# Patient Record
Sex: Female | Born: 1985 | Race: White | Hispanic: No | Marital: Married | Smoking: Never smoker
Health system: Southern US, Community
[De-identification: ages and names within clinical notes are randomized; demographics above are authoritative.]

## PROBLEM LIST (undated history)

## (undated) DIAGNOSIS — Z789 Other specified health status: Secondary | ICD-10-CM

## (undated) HISTORY — PX: NO PAST SURGERIES: SHX2092

## (undated) HISTORY — DX: Other specified health status: Z78.9

---

## 2018-07-15 ENCOUNTER — Ambulatory Visit (INDEPENDENT_AMBULATORY_CARE_PROVIDER_SITE_OTHER): Payer: BLUE CROSS/BLUE SHIELD | Admitting: Emergency Medicine

## 2018-07-15 ENCOUNTER — Encounter: Payer: Self-pay | Admitting: Family Medicine

## 2018-07-15 DIAGNOSIS — Z3201 Encounter for pregnancy test, result positive: Secondary | ICD-10-CM | POA: Diagnosis not present

## 2018-07-15 LAB — POCT PREGNANCY, URINE: Preg Test, Ur: POSITIVE — AB

## 2018-07-15 MED ORDER — PRENATAL PLUS 27-1 MG PO TABS: 1.0000 | ORAL_TABLET | Freq: Every day | ORAL | 11 refills | Status: DC

## 2018-07-15 MED ORDER — PROMETHAZINE HCL 25 MG PO TABS: 25.0000 mg | ORAL_TABLET | Freq: Four times a day (QID) | ORAL | 1 refills | Status: DC | PRN

## 2018-07-15 NOTE — Progress Notes (Signed)
Pt presents to the office today for UPT. UPT positive. Pt reports LMP 05/22/18. EDD 02/26/19 [redacted]w[redacted]d. Pt reports spotting two weeks ago x1 day and occasional mild cramps. Pt reassured unless she begins bleeding like a cycle or has severe pain that these symptoms can be normal in early pregnancy. Pt denies taking any medications or having any allergies to medications at this time. Pt reports having nausea and vomiting and would like Rx. Rx given for PNV and phenergan per protocol. Advised she begin prenatal care. Pt verbalized understanding and had no further questions.   Kathy Jan, RN 07/15/18

## 2018-07-15 NOTE — Progress Notes (Signed)
I have reviewed the chart and agree with nursing staff's documentation of this patient's encounter.  Thressa Sheller DNP, CNM  07/15/18  4:44 PM

## 2018-07-16 ENCOUNTER — Ambulatory Visit: Payer: Self-pay

## 2018-08-20 ENCOUNTER — Other Ambulatory Visit: Payer: Self-pay

## 2018-08-20 ENCOUNTER — Ambulatory Visit (INDEPENDENT_AMBULATORY_CARE_PROVIDER_SITE_OTHER): Payer: BLUE CROSS/BLUE SHIELD | Admitting: *Deleted

## 2018-08-20 ENCOUNTER — Encounter: Payer: Self-pay | Admitting: *Deleted

## 2018-08-20 VITALS — BP 110/62 | HR 77 | Ht 64.0 in | Wt 110.0 lb

## 2018-08-20 DIAGNOSIS — Z113 Encounter for screening for infections with a predominantly sexual mode of transmission: Secondary | ICD-10-CM | POA: Diagnosis not present

## 2018-08-20 DIAGNOSIS — Z23 Encounter for immunization: Secondary | ICD-10-CM

## 2018-08-20 DIAGNOSIS — O09299 Supervision of pregnancy with other poor reproductive or obstetric history, unspecified trimester: Secondary | ICD-10-CM

## 2018-08-20 DIAGNOSIS — Z789 Other specified health status: Secondary | ICD-10-CM

## 2018-08-20 DIAGNOSIS — O0990 Supervision of high risk pregnancy, unspecified, unspecified trimester: Secondary | ICD-10-CM

## 2018-08-20 DIAGNOSIS — Z758 Other problems related to medical facilities and other health care: Secondary | ICD-10-CM

## 2018-08-20 DIAGNOSIS — O09219 Supervision of pregnancy with history of pre-term labor, unspecified trimester: Secondary | ICD-10-CM

## 2018-08-20 LAB — POCT URINALYSIS DIP (DEVICE)
Bilirubin Urine: NEGATIVE
Glucose, UA: NEGATIVE mg/dL
Hgb urine dipstick: NEGATIVE
Ketones, ur: 15 mg/dL — AB
Leukocytes,Ua: NEGATIVE
NITRITE: NEGATIVE
Protein, ur: NEGATIVE mg/dL
Specific Gravity, Urine: >=1.03 (ref 1.005–1.030)
Urobilinogen, UA: 0.2 mg/dL (ref 0.0–1.0)
pH: 5.5 (ref 5.0–8.0)

## 2018-08-20 NOTE — Progress Notes (Signed)
New Ob intake completed and pregnancy information packet given. Pt's husband present. He speaks and reads English and will read pregnancy information to pt. Video interpreter Marwa [phone] used for encounter. Labs drawn. Pt expressed intense concern for this pregnancy due to her history of preterm birth and neonatal death 2 days after birth. She was only able to provide minimal information surrounding the birth. She states she had normal pregnancy until her water broke @ 29.[redacted] wks EGA. She delivered in [place] and was told no abnormalities of the baby. Emotional support provided. Initial prenatal visit scheduled on 3/26.

## 2018-08-21 ENCOUNTER — Encounter: Payer: Self-pay | Admitting: *Deleted

## 2018-08-21 DIAGNOSIS — O0990 Supervision of high risk pregnancy, unspecified, unspecified trimester: Secondary | ICD-10-CM | POA: Insufficient documentation

## 2018-08-21 DIAGNOSIS — O09299 Supervision of pregnancy with other poor reproductive or obstetric history, unspecified trimester: Secondary | ICD-10-CM | POA: Insufficient documentation

## 2018-08-21 DIAGNOSIS — O09219 Supervision of pregnancy with history of pre-term labor, unspecified trimester: Secondary | ICD-10-CM | POA: Insufficient documentation

## 2018-08-21 DIAGNOSIS — Z789 Other specified health status: Secondary | ICD-10-CM | POA: Insufficient documentation

## 2018-08-22 LAB — CULTURE, OB URINE

## 2018-08-22 LAB — GC/CHLAMYDIA PROBE AMP (~~LOC~~) NOT AT ARMC
Chlamydia: NEGATIVE
Neisseria Gonorrhea: NEGATIVE

## 2018-08-22 LAB — URINE CULTURE, OB REFLEX: Organism ID, Bacteria: NO GROWTH

## 2018-08-22 NOTE — Progress Notes (Signed)
I have reviewed this chart and agree with the RN/CMA assessment and management.    K. Meryl Davis, M.D. Attending [facility] (Faculty Practice)   

## 2018-09-03 ENCOUNTER — Encounter: Payer: BLUE CROSS/BLUE SHIELD | Admitting: Student

## 2018-09-03 ENCOUNTER — Encounter: Payer: BLUE CROSS/BLUE SHIELD | Admitting: Family Medicine

## 2018-09-03 LAB — HEMOGLOBINOPATHY EVALUATION
Ferritin: 64 ng/mL (ref 15–150)
Hgb A2 Quant: 2.3 % (ref 1.8–3.2)
Hgb A: 97.7 % (ref 96.4–98.8)
Hgb C: 0 %
Hgb F Quant: 0 % (ref 0.0–2.0)
Hgb S: 0 %
Hgb Solubility: NEGATIVE
Hgb Variant: 0 %

## 2018-09-03 LAB — OBSTETRIC PANEL, INCLUDING HIV
ANTIBODY SCREEN: NEGATIVE
BASOS: 1 %
Basophils Absolute: 0 10*3/uL (ref 0.0–0.2)
EOS (ABSOLUTE): 0 10*3/uL (ref 0.0–0.4)
Eos: 1 %
HEMATOCRIT: 31.1 % — AB (ref 34.0–46.6)
HIV Screen 4th Generation wRfx: NONREACTIVE
Hemoglobin: 10.9 g/dL — ABNORMAL LOW (ref 11.1–15.9)
Hepatitis B Surface Ag: NEGATIVE
IMMATURE GRANS (ABS): 0 10*3/uL (ref 0.0–0.1)
Immature Granulocytes: 0 %
Lymphocytes Absolute: 0.9 10*3/uL (ref 0.7–3.1)
Lymphs: 29 %
MCH: 32.1 pg (ref 26.6–33.0)
MCHC: 35 g/dL (ref 31.5–35.7)
MCV: 92 fL (ref 79–97)
Monocytes Absolute: 0.3 10*3/uL (ref 0.1–0.9)
Monocytes: 9 %
Neutrophils Absolute: 1.8 10*3/uL (ref 1.4–7.0)
Neutrophils: 60 %
Platelets: 206 10*3/uL (ref 150–450)
RBC: 3.4 x10E6/uL — ABNORMAL LOW (ref 3.77–5.28)
RDW: 13.8 % (ref 11.7–15.4)
RH TYPE: POSITIVE
RPR Ser Ql: NONREACTIVE
Rubella Antibodies, IGG: 13.6 index (ref >0.99)
WBC: 3 10*3/uL — ABNORMAL LOW (ref 3.4–10.8)

## 2018-09-03 LAB — INHERITEST(R) CF/SMA PANEL

## 2018-09-07 ENCOUNTER — Other Ambulatory Visit: Payer: Self-pay | Admitting: Advanced Practice Midwife

## 2018-09-07 DIAGNOSIS — Z3201 Encounter for pregnancy test, result positive: Secondary | ICD-10-CM

## 2018-09-12 ENCOUNTER — Encounter: Payer: BLUE CROSS/BLUE SHIELD | Admitting: Obstetrics and Gynecology

## 2018-09-25 ENCOUNTER — Encounter: Payer: BLUE CROSS/BLUE SHIELD | Admitting: Obstetrics and Gynecology

## 2018-09-30 ENCOUNTER — Encounter: Payer: BLUE CROSS/BLUE SHIELD | Admitting: Family Medicine

## 2018-10-04 ENCOUNTER — Encounter: Payer: BLUE CROSS/BLUE SHIELD | Admitting: Advanced Practice Midwife

## 2018-10-05 ENCOUNTER — Inpatient Hospital Stay (HOSPITAL_COMMUNITY): Payer: BLUE CROSS/BLUE SHIELD

## 2018-10-05 ENCOUNTER — Inpatient Hospital Stay (HOSPITAL_COMMUNITY)
Admission: AD | Admit: 2018-10-05 | Discharge: 2018-10-06 | DRG: 779 | Disposition: A | Discharge status: Home / Self Care | Payer: BLUE CROSS/BLUE SHIELD | Attending: Obstetrics and Gynecology | Admitting: Obstetrics and Gynecology

## 2018-10-05 ENCOUNTER — Other Ambulatory Visit: Payer: Self-pay

## 2018-10-05 ENCOUNTER — Encounter (HOSPITAL_COMMUNITY): Payer: Self-pay | Admitting: *Deleted

## 2018-10-05 DIAGNOSIS — Z789 Other specified health status: Secondary | ICD-10-CM

## 2018-10-05 DIAGNOSIS — O4692 Antepartum hemorrhage, unspecified, second trimester: Secondary | ICD-10-CM | POA: Diagnosis not present

## 2018-10-05 DIAGNOSIS — O09212 Supervision of pregnancy with history of pre-term labor, second trimester: Secondary | ICD-10-CM | POA: Diagnosis not present

## 2018-10-05 DIAGNOSIS — O09299 Supervision of pregnancy with other poor reproductive or obstetric history, unspecified trimester: Secondary | ICD-10-CM | POA: Diagnosis not present

## 2018-10-05 DIAGNOSIS — O039 Complete or unspecified spontaneous abortion without complication: Secondary | ICD-10-CM | POA: Diagnosis not present

## 2018-10-05 DIAGNOSIS — O0990 Supervision of high risk pregnancy, unspecified, unspecified trimester: Secondary | ICD-10-CM

## 2018-10-05 DIAGNOSIS — O3432 Maternal care for cervical incompetence, second trimester: Secondary | ICD-10-CM | POA: Diagnosis not present

## 2018-10-05 DIAGNOSIS — Z3A19 19 weeks gestation of pregnancy: Secondary | ICD-10-CM

## 2018-10-05 DIAGNOSIS — O09292 Supervision of pregnancy with other poor reproductive or obstetric history, second trimester: Secondary | ICD-10-CM | POA: Diagnosis not present

## 2018-10-05 DIAGNOSIS — O209 Hemorrhage in early pregnancy, unspecified: Secondary | ICD-10-CM | POA: Diagnosis not present

## 2018-10-05 DIAGNOSIS — Z758 Other problems related to medical facilities and other health care: Secondary | ICD-10-CM | POA: Diagnosis present

## 2018-10-05 DIAGNOSIS — O9081 Anemia of the puerperium: Secondary | ICD-10-CM | POA: Diagnosis present

## 2018-10-05 DIAGNOSIS — O09219 Supervision of pregnancy with history of pre-term labor, unspecified trimester: Secondary | ICD-10-CM

## 2018-10-05 LAB — CBC
HCT: 33.5 % — ABNORMAL LOW (ref 36.0–46.0)
Hemoglobin: 11.3 g/dL — ABNORMAL LOW (ref 12.0–15.0)
MCH: 31.6 pg (ref 26.0–34.0)
MCHC: 33.7 g/dL (ref 30.0–36.0)
MCV: 93.6 fL (ref 80.0–100.0)
Platelets: 171 10*3/uL (ref 150–400)
RBC: 3.58 MIL/uL — ABNORMAL LOW (ref 3.87–5.11)
RDW: 14.2 % (ref 11.5–15.5)
WBC: 4.3 10*3/uL (ref 4.0–10.5)
nRBC: 0 % (ref 0.0–0.2)

## 2018-10-05 LAB — URINALYSIS, ROUTINE W REFLEX MICROSCOPIC
Bilirubin Urine: NEGATIVE
Glucose, UA: NEGATIVE mg/dL
Ketones, ur: NEGATIVE mg/dL
Leukocytes,Ua: NEGATIVE
Nitrite: NEGATIVE
Protein, ur: NEGATIVE mg/dL
RBC / HPF: >50 RBC/hpf — ABNORMAL HIGH (ref 0–5)
Specific Gravity, Urine: 1.005 (ref 1.005–1.030)
pH: 6 (ref 5.0–8.0)

## 2018-10-05 LAB — TYPE AND SCREEN
ABO/RH(D): A POS
Antibody Screen: NEGATIVE

## 2018-10-05 MED ORDER — PRENATAL PLUS 27-1 MG PO TABS
1.0000 | ORAL_TABLET | Freq: Every day | ORAL | Status: DC
  Filled 2018-10-05: qty 1

## 2018-10-05 MED ORDER — ZOLPIDEM TARTRATE 5 MG PO TABS: 5.0000 mg | ORAL_TABLET | Freq: Every evening | ORAL | Status: DC | PRN

## 2018-10-05 MED ORDER — ACETAMINOPHEN 325 MG PO TABS
650.0000 mg | ORAL_TABLET | ORAL | Status: DC | PRN
  Administered 2018-10-06: 650 mg via ORAL
  Filled 2018-10-05: qty 2

## 2018-10-05 MED ORDER — CALCIUM CARBONATE ANTACID 500 MG PO CHEW: 2.0000 | CHEWABLE_TABLET | ORAL | Status: DC | PRN

## 2018-10-05 MED ORDER — DOCUSATE SODIUM 100 MG PO CAPS
100.0000 mg | ORAL_CAPSULE | Freq: Every day | ORAL | Status: DC
  Administered 2018-10-05: 100 mg via ORAL
  Filled 2018-10-05: qty 1

## 2018-10-05 MED ORDER — LACTATED RINGERS IV SOLN
INTRAVENOUS | Status: DC
  Administered 2018-10-05 – 2018-10-06 (×4): via INTRAVENOUS

## 2018-10-05 NOTE — Plan of Care (Signed)
  Problem: Education: Goal: Knowledge of disease or condition will improve Outcome: Progressing   

## 2018-10-05 NOTE — MAU Provider Note (Signed)
Chief Complaint: Abdominal Pain and Vaginal Bleeding   First Provider Initiated Contact with Patient 10/05/18 1349     SUBJECTIVE HPI: Krista Might is a 33 y.o. G2P0100 at [redacted]w[redacted]d who presents to Maternity Admissions reporting abdominal pain & vaginal bleeding. Symptoms began this morning. Reports bleeding like a period. Had intercourse last night.  Supposed to go to [facility] for prenatal care but reports her appointment has been changed multiple times. Had new ob intake with the nurse. Her new ob appointment is for this coming Monday. Has a history of a 29 wk delivery last year, baby did not survive.   Location: abdomen Quality: cramping Severity: 5/10 on pain scale Duration: 4 hours Timing: intermittent Modifying factors: none Associated signs and symptoms: vaginal bleeding  Past Medical History:  Diagnosis Date  . Medical history non-contributory    OB History  Gravida Para Term Preterm AB Living  2 1   1    0  SAB TAB Ectopic Multiple Live Births          1    # Outcome Date GA Lbr Len/2nd Weight Sex Delivery Anes PTL Lv  2 Current           1 Preterm 11/05/17 [redacted]w[redacted]d  2000 g F Vag-Spont  N ND   Past Surgical History:  Procedure Laterality Date  . NO PAST SURGERIES     Social History   Socioeconomic History  . Marital status: Married    Spouse name: Althia Forts  . Number of children: Not on file  . Years of education: Not on file  . Highest education level: Bachelor's degree (e.g., BA, AB, BS)  Occupational History  . Not on file  Social Needs  . Financial resource strain: Not on file  . Food insecurity:    Worry: Never true    Inability: Never true  . Transportation needs:    Medical: No    Non-medical: No  Tobacco Use  . Smoking status: Never Smoker  . Smokeless tobacco: Never Used  Substance and Sexual Activity  . Alcohol use: Never    Frequency: Never  . Drug use: Never  . Sexual activity: Yes  Lifestyle  . Physical activity:    Days per week:  Not on file    Minutes per session: Not on file  . Stress: Not on file  Relationships  . Social connections:    Talks on phone: Not on file    Gets together: Not on file    Attends religious service: Not on file    Active member of club or organization: Not on file    Attends meetings of clubs or organizations: Not on file    Relationship status: Not on file  . Intimate partner violence:    Fear of current or ex partner: No    Emotionally abused: No    Physically abused: No    Forced sexual activity: No  Other Topics Concern  . Not on file  Social History Narrative  . Not on file   Family History  Problem Relation Age of Onset  . Healthy Mother   . Diabetes Father   . Heart disease Father   . Hypertension Father    No current facility-administered medications on file prior to encounter.    Current Outpatient Medications on File Prior to Encounter  Medication Sig Dispense Refill  . prenatal vitamin w/FE, FA (PRENATAL 1 + 1) 27-1 MG TABS tablet Take 1 tablet by mouth daily at 12 noon. 30  each 11  . promethazine (PHENERGAN) 25 MG tablet TAKE 1 TABLET BY MOUTH EVERY 6 HOURS AS NEEDED FOR FOR NAUSEA AND VOMITING 30 tablet 1   No Known Allergies  I have reviewed patient's Past Medical Hx, Surgical Hx, Family Hx, Social Hx, medications and allergies.   Review of Systems  Constitutional: Negative.   Gastrointestinal: Positive for abdominal pain.  Genitourinary: Positive for vaginal bleeding.    OBJECTIVE Patient Vitals for the past 24 hrs:  BP Pulse Resp SpO2 Height Weight  10/05/18 1320 112/63 90 18 100 % - -  10/05/18 1316 - - - - 5\' 4"  (1.626 m) 51.1 kg   Constitutional: Well-developed, well-nourished female in no acute distress.  Cardiovascular: normal rate & rhythm, no murmur Respiratory: normal rate and effort. Lung sounds clear throughout GI: Abd soft, non-tender, Pos BS x 4. No guarding or rebound tenderness MS: Extremities nontender, no edema, normal  ROM Neurologic: Alert and oriented x 4.  GU: membranes seen on spec exam. Gentle digital exam performed, could not feel cervix.    LAB RESULTS Results for orders placed or performed during the hospital encounter of 10/05/18 (from the past 24 hour(s))  Urinalysis, Routine w reflex microscopic     Status: Abnormal   Collection Time: 10/05/18  1:29 PM  Result Value Ref Range   Color, Urine STRAW (A) YELLOW   APPearance CLEAR CLEAR   Specific Gravity, Urine 1.005 1.005 - 1.030   pH 6.0 5.0 - 8.0   Glucose, UA NEGATIVE NEGATIVE mg/dL   Hgb urine dipstick LARGE (A) NEGATIVE   Bilirubin Urine NEGATIVE NEGATIVE   Ketones, ur NEGATIVE NEGATIVE mg/dL   Protein, ur NEGATIVE NEGATIVE mg/dL   Nitrite NEGATIVE NEGATIVE   Leukocytes,Ua NEGATIVE NEGATIVE   RBC / HPF >50 (H) 0 - 5 RBC/hpf   WBC, UA 0-5 0 - 5 WBC/hpf   Bacteria, UA RARE (A) NONE SEEN   Squamous Epithelial / LPF 0-5 0 - 5   Mucus PRESENT     IMAGING No results found.  MAU COURSE Orders Placed This Encounter  Procedures  . US MFM OB LIMITED  . Urinalysis, Routine w reflex microscopic  . CBC  . Type and screen   Meds ordered this encounter  Medications  . lactated ringers infusion    MDM FHT present by doppler  On exam patient with BBOW. Unable to feel cervix but exam not thorough as didn't want to rupture membranes. Ultrasound ordered.  Per ultrasound, no fetal part extending past cervix & cervix appears to be 2 cm dilated.   C/w Dr. Alysia PennaErvin. Pt in trendelenburg. Will come see patient to discuss plan of care.   ASSESSMENT 1. Cervical incompetence during pregnancy in second trimester   2. [redacted] weeks gestation of pregnancy   3. Vaginal bleeding in pregnancy, second trimester     PLAN Admit to antenatal Keep in trendelenburg  Judeth HornLawrence, Maryalice Pasley, NP 10/05/2018  2:47 PM

## 2018-10-05 NOTE — MAU Note (Signed)
Kathy Wilkerson is a 33 y.o. at [redacted]w[redacted]d here in MAU reporting: started bleeding today around 10 and started having abdominal pain. States the bleeding is similar to a period. States she is wearing a pad and has only worn this pad today- has not had to change. Last IC was last night  Onset of complaint: today  Pain score: 5/10  Vitals:   10/05/18 1320  BP: 112/63  Pulse: 90  Resp: 18  SpO2: 100%  (no thermometer probe covers in triage, will take in the room)     Lab orders placed from triage: UA

## 2018-10-05 NOTE — H&P (Signed)
Sahniya Henneman is a 33 y.o. female  G2P0100 IUP 19 3/7 weekspresenting for vaginal bleeding, abd cramps and vaginal pressure. POB significant with PROM at 29 weeks with SVD and infant demise 2 days later, 1 yr ago in [place].  Pt reports started this morning. Denies LOF.  OB History    Gravida  2   Para  1   Term      Preterm  1   AB      Living  0     SAB      TAB      Ectopic      Multiple      Live Births  1          Past Medical History:  Diagnosis Date  . Medical history non-contributory    Past Surgical History:  Procedure Laterality Date  . NO PAST SURGERIES     Family History: family history includes Diabetes in her father; Healthy in her mother; Heart disease in her father; Hypertension in her father. Social History:  reports that she has never smoked. She has never used smokeless tobacco. She reports that she does not drink alcohol or use drugs.      Review of Systems  Constitutional: Negative.   Respiratory: Negative.   Cardiovascular: Negative.   Gastrointestinal: Negative.   Genitourinary: Negative.    History   Blood pressure 112/63, pulse 90, resp. rate 18, height 5\' 4"  (1.626 m), weight 51.1 kg, last menstrual period 05/22/2018, SpO2 100 %. Exam Physical Exam  Constitutional: She appears well-developed and well-nourished.  Cardiovascular: Normal rate and regular rhythm.  Respiratory: Effort normal and breath sounds normal.  GI: Soft. Bowel sounds are normal.  garvid  Genitourinary:    Genitourinary Comments: See MAU note     Prenatal labs: ABO, Rh: --/--/A POS, A POS Performed at [facility], [address], [place], [place] [address]  [phone]) Antibody: NEG (04/18 1413) Rubella: 13.60 (03/03 1518) RPR: Non Reactive (03/03 1518)  HBsAg: Negative (03/03 1518)  HIV: Non Reactive (03/03 1518)  GBS:     Assessment/Plan: IUP 19 3/7 weeks Probable IC with bugling BOM in vaginal per U/S and SSE.  H/O 29 week PROM with  fetal demise  Exam findings and U/S reviewed with pt and FOB. FOB served as interrupter after signing papers. Management options reviewed with pt and FOB. After discussion, pt and FOB desire to be admitted, place in [place], strict bed rest with foley and reevaluate in 24-48 hours. If BOM has recessed back past the cervical os one could consider rescue cerclage. Pt and FOB made aware high risk of ROM with probable inevitable delivery in the meantime. [name] 10/05/2018, 4:01 PM

## 2018-10-06 ENCOUNTER — Encounter (HOSPITAL_COMMUNITY): Payer: Self-pay | Admitting: Obstetrics & Gynecology

## 2018-10-06 DIAGNOSIS — O9081 Anemia of the puerperium: Secondary | ICD-10-CM | POA: Diagnosis present

## 2018-10-06 DIAGNOSIS — O039 Complete or unspecified spontaneous abortion without complication: Secondary | ICD-10-CM | POA: Diagnosis present

## 2018-10-06 DIAGNOSIS — O209 Hemorrhage in early pregnancy, unspecified: Secondary | ICD-10-CM | POA: Diagnosis present

## 2018-10-06 DIAGNOSIS — O09299 Supervision of pregnancy with other poor reproductive or obstetric history, unspecified trimester: Secondary | ICD-10-CM | POA: Diagnosis not present

## 2018-10-06 DIAGNOSIS — Z3A19 19 weeks gestation of pregnancy: Secondary | ICD-10-CM | POA: Diagnosis not present

## 2018-10-06 LAB — CBC
HCT: 25.5 % — ABNORMAL LOW (ref 36.0–46.0)
Hemoglobin: 8.8 g/dL — ABNORMAL LOW (ref 12.0–15.0)
MCH: 31.7 pg (ref 26.0–34.0)
MCHC: 34.5 g/dL (ref 30.0–36.0)
MCV: 91.7 fL (ref 80.0–100.0)
Platelets: 160 10*3/uL (ref 150–400)
RBC: 2.78 MIL/uL — ABNORMAL LOW (ref 3.87–5.11)
RDW: 13.9 % (ref 11.5–15.5)
WBC: 6.1 10*3/uL (ref 4.0–10.5)
nRBC: 0 % (ref 0.0–0.2)

## 2018-10-06 LAB — DIC (DISSEMINATED INTRAVASCULAR COAGULATION)PANEL
D-Dimer, Quant: 2.61 ug/mL-FEU — ABNORMAL HIGH (ref 0.00–0.50)
Fibrinogen: 274 mg/dL (ref 210–475)
Platelets: 153 10*3/uL (ref 150–400)
Prothrombin Time: 15.4 seconds — ABNORMAL HIGH (ref 11.4–15.2)
Smear Review: NONE SEEN
aPTT: 31 seconds (ref 24–36)

## 2018-10-06 LAB — DIC (DISSEMINATED INTRAVASCULAR COAGULATION) PANEL: INR: 1.2 (ref 0.8–1.2)

## 2018-10-06 LAB — ABO/RH: ABO/RH(D): A POS

## 2018-10-06 MED ORDER — SODIUM CHLORIDE 0.9 % IV SOLN: 510.0000 mg | Freq: Once | INTRAVENOUS | Status: DC

## 2018-10-06 MED ORDER — MISOPROSTOL 200 MCG PO TABS
600.0000 ug | ORAL_TABLET | Freq: Once | ORAL | Status: AC
  Administered 2018-10-06: 600 ug via ORAL

## 2018-10-06 MED ORDER — LACTATED RINGERS IV BOLUS
500.0000 mL | Freq: Once | INTRAVENOUS | Status: AC
  Administered 2018-10-06: 11:00:00 500 mL via INTRAVENOUS

## 2018-10-06 MED ORDER — FERROUS SULFATE 325 (65 FE) MG PO TABS: 325.0000 mg | ORAL_TABLET | Freq: Two times a day (BID) | ORAL | 1 refills | Status: DC

## 2018-10-06 MED ORDER — HYDROMORPHONE HCL 1 MG/ML IJ SOLN
1.0000 mg | INTRAMUSCULAR | Status: DC | PRN
  Administered 2018-10-06 (×2): 1 mg via INTRAVENOUS
  Filled 2018-10-06 (×2): qty 1

## 2018-10-06 MED ORDER — SODIUM CHLORIDE 0.9 % IV SOLN
510.0000 mg | Freq: Once | INTRAVENOUS | Status: AC
  Administered 2018-10-06: 510 mg via INTRAVENOUS
  Filled 2018-10-06: qty 17

## 2018-10-06 MED ORDER — DOXYCYCLINE HYCLATE 100 MG PO TABS
200.0000 mg | ORAL_TABLET | ORAL | Status: AC
  Administered 2018-10-06: 200 mg via ORAL
  Filled 2018-10-06: qty 2

## 2018-10-06 MED ORDER — MISOPROSTOL 200 MCG PO TABS
ORAL_TABLET | ORAL | Status: AC
  Filled 2018-10-06: qty 1

## 2018-10-06 MED ORDER — PROMETHAZINE HCL 25 MG/ML IJ SOLN
12.5000 mg | Freq: Once | INTRAMUSCULAR | Status: AC
  Administered 2018-10-06: 08:00:00 12.5 mg via INTRAVENOUS
  Filled 2018-10-06: qty 1

## 2018-10-06 MED ORDER — IBUPROFEN 600 MG PO TABS: 600.0000 mg | ORAL_TABLET | Freq: Four times a day (QID) | ORAL | 3 refills | Status: DC | PRN

## 2018-10-06 MED ORDER — MISOPROSTOL 200 MCG PO TABS
ORAL_TABLET | ORAL | Status: AC
  Filled 2018-10-06: qty 3

## 2018-10-06 MED ORDER — OXYTOCIN 40 UNITS IN NORMAL SALINE INFUSION - SIMPLE MED
INTRAVENOUS | Status: AC
  Filled 2018-10-06: qty 1000

## 2018-10-06 MED ORDER — MISOPROSTOL 200 MCG PO TABS
800.0000 ug | ORAL_TABLET | ORAL | Status: AC
  Administered 2018-10-06: 800 ug via RECTAL

## 2018-10-06 MED ORDER — METHYLERGONOVINE MALEATE 0.2 MG/ML IJ SOLN
0.2000 mg | Freq: Once | INTRAMUSCULAR | Status: AC
  Administered 2018-10-06: 11:00:00 0.2 mg via INTRAMUSCULAR

## 2018-10-06 MED ORDER — DOXYCYCLINE HYCLATE 100 MG PO TABS: 200.0000 mg | ORAL_TABLET | Freq: Once | ORAL | Status: DC

## 2018-10-06 MED ORDER — MISOPROSTOL 200 MCG PO TABS
ORAL_TABLET | ORAL | Status: AC
  Filled 2018-10-06: qty 4

## 2018-10-06 MED ORDER — AZITHROMYCIN 250 MG PO TABS: 1000.0000 mg | ORAL_TABLET | Freq: Once | ORAL | Status: DC

## 2018-10-06 MED ORDER — METHYLERGONOVINE MALEATE 0.2 MG/ML IJ SOLN
INTRAMUSCULAR | Status: AC
  Filled 2018-10-06: qty 1

## 2018-10-06 MED ORDER — DOCUSATE SODIUM 100 MG PO CAPS: 100.0000 mg | ORAL_CAPSULE | Freq: Two times a day (BID) | ORAL | 2 refills | Status: DC | PRN

## 2018-10-06 NOTE — Discharge Summary (Signed)
Physician Discharge Summary  Patient ID: Kathy Wilkerson MRN: [id number] DOB/AGE: 33/02/1986 33 y.o.  Admit date: 10/05/2018 Discharge date: 10/06/2018  Admission Diagnoses:  Discharge Diagnoses:  Principal Problem:   Spontaneous miscarriage at 9232w4d Active Problems:   Supervision of high risk pregnancy, antepartum   Previous preterm delivery, antepartum   Neonatal death in prior pregnancy, currently pregnant   Language barrier   Incompetence of cervix   Postpartum anemia  Discharged Condition: Stable  Hospital Course: Kathy MostKhulood Sleep is a 33 y.o. female  G2P0100 who was admitted at 3019 3/[redacted] weeks gestation after presenting for vaginal bleeding, cramping and vaginal pressure. She was examined and found to have cervical incompetence with BBOW in vagina. She was counseled about the poor prognosis but she wanted to be admitted and placed in Trendelenberg, to see if BBOW would recede. However, around 0600 on 10/06/18, she had delivery of nonviable female fetus (please see Dr. Waynard EdwardsErvin's note for more details). Misoprostol was given to her to help with placental delivery; this occurred around 1030 but significant for large clots and blood loss during placental delivery (please refer to my previous notes). Labs showed anemia with hemoglobin of 8.8 from 11.3.  Feraheme x 1 dose was ordered, also discharged on oral iron. Doxycycline 200 mg po x 1 was given for infection prophylaxis. She was monitored for about 5 hours after delivery and was noted to be stable, minimal bleeding and no other immediate complications. She was deemed stable for discharge to home with outpatient follow up.   Significant Diagnostic Studies: Results for orders placed or performed during the hospital encounter of 10/05/18 (from the past 72 hour(s))  Urinalysis, Routine w reflex microscopic     Status: Abnormal   Collection Time: 10/05/18  1:29 PM  Result Value Ref Range   Color, Urine STRAW (A) YELLOW   APPearance CLEAR CLEAR   Specific Gravity, Urine 1.005 1.005 - 1.030   pH 6.0 5.0 - 8.0   Glucose, UA NEGATIVE NEGATIVE mg/dL   Hgb urine dipstick LARGE (A) NEGATIVE   Bilirubin Urine NEGATIVE NEGATIVE   Ketones, ur NEGATIVE NEGATIVE mg/dL   Protein, ur NEGATIVE NEGATIVE mg/dL   Nitrite NEGATIVE NEGATIVE   Leukocytes,Ua NEGATIVE NEGATIVE   RBC / HPF >50 (H) 0 - 5 RBC/hpf   WBC, UA 0-5 0 - 5 WBC/hpf   Bacteria, UA RARE (A) NONE SEEN   Squamous Epithelial / LPF 0-5 0 - 5   Mucus PRESENT     Comment: Performed at [facility], [address] [place], [place] [address]  CBC     Status: Abnormal   Collection Time: 10/05/18  2:13 PM  Result Value Ref Range   WBC 4.3 4.0 - 10.5 K/uL   RBC 3.58 (L) 3.87 - 5.11 MIL/uL   Hemoglobin 11.3 (L) 12.0 - 15.0 g/dL   HCT 52.833.5 (L) 41.336.0 - 24.446.0 %   MCV 93.6 80.0 - 100.0 fL   MCH 31.6 26.0 - 34.0 pg   MCHC 33.7 30.0 - 36.0 g/dL   RDW 01.014.2 27.211.5 - 53.615.5 %   Platelets 171 150 - 400 K/uL   nRBC 0.0 0.0 - 0.2 %    Comment: Performed at [facility], [address] [place], [place] [address]  Type and screen     Status: None   Collection Time: 10/05/18  2:13 PM  Result Value Ref Range   ABO/RH(D) A POS    Antibody Screen NEG    Sample Expiration  10/08/2018 Performed at [facility], [address] [place], [place] [address]   ABO/Rh     Status: None   Collection Time: 10/05/18  2:13 PM  Result Value Ref Range   ABO/RH(D) A POS    No rh immune globuloin      NOT A RH IMMUNE GLOBULIN CANDIDATE, PT RH POSITIVE Performed at [facility], [address], [place], [place] [address]   CBC     Status: Abnormal   Collection Time: 10/06/18 11:22 AM  Result Value Ref Range   WBC 6.1 4.0 - 10.5 K/uL   RBC 2.78 (L) 3.87 - 5.11 MIL/uL   Hemoglobin 8.8 (L) 12.0 - 15.0 g/dL   HCT 94.5 (L) 85.9 - 29.2 %   MCV 91.7 80.0 - 100.0 fL   MCH 31.7 26.0 - 34.0 pg   MCHC 34.5 30.0 - 36.0 g/dL   RDW 44.6 28.6 - 38.1 %   Platelets 160 150 - 400 K/uL   nRBC  0.0 0.0 - 0.2 %    Comment: Performed at [facility], [address] [place], [place] [address]  DIC (disseminated intravasc coag) panel     Status: Abnormal   Collection Time: 10/06/18 11:22 AM  Result Value Ref Range   Prothrombin Time 15.4 (H) 11.4 - 15.2 seconds   INR 1.2 0.8 - 1.2    Comment: (NOTE) INR goal varies based on device and disease states.    aPTT 31 24 - 36 seconds   Fibrinogen 274 210 - 475 mg/dL   D-Dimer, Quant 5.79 (H) 0.00 - 0.50 ug/mL-FEU    Comment: (NOTE) At the manufacturer cut-off of 0.50 ug/mL FEU, this assay has been documented to exclude PE with a sensitivity and negative predictive value of 97 to 99%.  At this time, this assay has not been approved by the FDA to exclude DVT/VTE. Results should be correlated with clinical presentation.    Platelets 153 150 - 400 K/uL   Smear Review NO SCHISTOCYTES SEEN     Comment: Performed at [facility], [address] [place], [place] [address]   Discharge Exam: Blood pressure (!) 89/51, pulse 78, temperature 98.7 F (37.1 C), temperature source Oral, resp. rate 16, height 5\' 4"  (1.626 m), weight 51.1 kg, last menstrual period 05/22/2018, SpO2 100 %. Constitutional: No acute distress.  Cardiovascular: normal rate & rhythm, no murmur Respiratory: normal rate and effort.  GI: Abd soft, non-tender, fundus firm very low below umbilicus Pelvic: minimal bleeding on pad  MS: Extremities nontender, no edema, normal ROM Neurologic: Alert and oriented x 4.    Discharge disposition: 01-Home or Self Care   Discharge Instructions    Discharge patient   Complete by:  As directed    Discharge around 1500 (after observation after giving Feraheme and if she remains stable)   Discharge disposition:  01-Home or Self Care   Discharge patient date:  10/06/2018     Allergies as of 10/06/2018   No Known Allergies     Medication List    STOP taking these medications   prenatal vitamin w/FE, FA 27-1 MG Tabs  tablet   promethazine 25 MG tablet Commonly known as:  PHENERGAN     TAKE these medications   docusate sodium 100 MG capsule Commonly known as:  COLACE Take 1 capsule (100 mg total) by mouth 2 (two) times daily as needed for mild constipation or moderate constipation.   ferrous sulfate 325 (  65 FE) MG tablet Commonly known as:  FerrouSul Take 1 tablet (325 mg total) by mouth 2 (two) times daily.   ibuprofen 600 MG tablet Commonly known as:  ADVIL Take 1 tablet (600 mg total) by mouth every 6 (six) hours as needed.      Follow-up Information    [facility] Follow up in 1 week(s).   Specialty:  Obstetrics and Gynecology Why:  Mood check with Asher Muir and Virtual Visit with Provider Contact information: [address] [place] [address], [address] [id number] mc [place] [address] [phone]          Signed: Jaynie Collins 10/06/2018, 7:13 PM

## 2018-10-06 NOTE — Progress Notes (Addendum)
Patient called nurse in sever abdominal pain. Gush of blood noted in vaginal canal followed by footling breech delivery of a female fetus, heartbeat nor breathing noted.Dr. Alysia Penna at the nurse's station notified.  Cord clamped and cut.  Blood loss at this time 125cc.  V/S recorded.  Awaiting delivery of placenta.   Weight = 660 grms  Dr. Rande Lawman talked to patient's spouse thru phone and update given.   Pictures taken

## 2018-10-06 NOTE — Progress Notes (Signed)
Patient discharged via wheelchair.  Fetus sent with patient and husband for burial in [place] before sunset.  Tia Alert, [facility] had husband sign appropriate papers prior to discharge.

## 2018-10-06 NOTE — Progress Notes (Signed)
Patient ID: Kathy Wilkerson, female   DOB: 1985/12/04, 33 y.o.   MRN: [id number] Pt delivered non viable female infant at 0600. Cord clamped and cut. 600 mcg of Cytotec given. Will allow placenta to delivery spontaneous. Phone call to husband who relayed information to pt and POC. Husband asked to come to hospital as well.

## 2018-10-06 NOTE — Discharge Instructions (Signed)
Vaginal Delivery, Care After  This sheet gives you information about how to care for yourself after your delivery. Your health care provider may also give you more specific instructions. If you have problems or questions, contact your health care provider.    What can I expect after the procedure?  After delivery, it is common to have:   Soreness in your abdomen, your vagina, and the area between the opening of your vagina and your anus (perineum).   Tiredness (fatigue).   Cramps.   Breast tenderness related to engorgement.   Some bleeding and discharge from your vagina. This may continue for about 6 weeks. The bleeding and discharge will start out red, then become pink, then yellow, and finally white.   Tenderness in your vagina or perineum if you had an episiotomy or a vaginal tear. This may last several weeks.   Emotions that change quickly. Common emotions include:  ? Sadness.  ? Anger.  ? Denial.  ? Guilt.  Depression.  Follow these instructions at home:  Vaginal and perineal care   Keep your perineum clean and dry as told by your health care provider.   Wipe from front to back when you use the toilet.   If you have an episiotomy or a vaginal tear, check for signs of infection, such as:  ? Increasing redness, swelling, or pain in your perineal area.  ? Pus or bad-smelling discharge coming from your wound or vagina.   To relieve pain at the wound area or pain caused by hemorrhoids, try taking a warm sitz bath 2-3 times a day.   Do not use tampons or douche until your health care provider says it is okay.  Medicines   Take over-the-counter and prescription medicines only as told by your health care provider.   If you were prescribed an antibiotic medicine, take it as told by your health care provider. Do not stop taking the antibiotic even if you start to feel better.  Eating and drinking     Drink enough fluids to keep your urine pale yellow.   Eat high-fiber foods every day. These foods may help  prevent or relieve constipation. High-fiber foods include:  ? Whole grain cereals and breads.  ? Brown rice.  ? Beans.  ? Fresh fruits and vegetables.  Activity   If possible, have someone help you with your household activities for at least a few days after you leave the hospital.   Return to your normal activities as told by your health care provider. Ask your health care provider what activities are safe for you.   Rest as much as possible.   Talk with your health care provider about when you can engage in sexual activity. This may depend on your:  ? Risk of infection.  ? Rate of healing.  ? Comfort and desire to engage in sexual activity.  Emotional support   Consider seeking support for your loss. Some forms of support that you might consider include your religious leader, friends, family, a professional counselor, or a bereavement support group.  General instructions   Wear a supportive and well-fitting bra.   If you pass a blood clot, save it and call your health care provider to discuss. Do not flush blood clots down the toilet before you get instructions from your health care provider.   Keep all of your scheduled postpartum visits. At these visits, your health care provider will check to make sure that you are healing, both physically and   emotionally.  Contact a health care provider if:   You feel sad or depressed.   You are having trouble eating or sleeping.   You lose interest in activities you used to enjoy.   You pass a blood clot from your vagina.   You have pus or a bad-smelling discharge coming from your wound or vagina.   You have increasing redness, swelling, or pain in your perineal area.   You feel pain or burning when you urinate.   You urinate more often than normal.   You have a fever.   Your breasts become hard, red, or painful.   You are dizzy or light-headed.   You have a rash.   You feel nauseous or you vomit.   You have not had a menstrual period by the 12th week  after delivery.  Get help right away if:   You have persistent pain that is not relieved by comfort measures or medicines.   You have chest pain or trouble breathing.   You have blurred vision or you see spots.   You have a severe headache.   You faint.   You have sudden, severe leg pain.   You bleed from your vagina so much that you fill more than one sanitary pad in one hour. Bleeding should not be heavier than your heaviest period.   You have thoughts of hurting yourself.  If you ever feel like you may hurt yourself or others, or have thoughts about taking your own life, get help right away. You can go to your nearest emergency department or call:   Your local emergency services (911 in the [place]   A suicide crisis helpline, such as the National Suicide Prevention Lifeline at 1-800-273-8255. This is open 24 hours a day.  Summary   Take over-the-counter and prescription medicines only as told by your health care provider.   Return to your normal activities as told by your health care provider. Ask your health care provider what activities are safe for you.   Consider getting support for your loss. Sources of support include religious leaders, friends, family, professional counselors, and bereavement support groups.   Keep all follow-up visits as told by your health care provider. This is important.  This information is not intended to replace advice given to you by your health care provider. Make sure you discuss any questions you have with your health care provider.  Document Released: 10/20/2013 Document Revised: 03/19/2017 Document Reviewed: 09/14/2016  Elsevier Interactive Patient Education  2019 Elsevier Inc.

## 2018-10-06 NOTE — Progress Notes (Signed)
Faculty Practice OB/GYN Attending Note  Came to evaluate patient who had oral misoprostol given after delivery of non-viable [redacted]w[redacted]d female around 0600 today.  Minimal bleeding noted by RN exam, no delivery of placenta yet.  Talked to patient and her husband, discussed need for internal examination and possible repeating of misoprostol if needed.  She agreed to examination.  On sterile bimanual exam, placenta noted bulging from external os with large clots in upper vagina.  Fundal massage administered and placenta delivered around 1030 with large blood clots.  Further bimanual examination yielded large retroplacental clots, minimal active bleeding, uterus noted to be firm after examination. Misoprostol 800 mcg placed PR, Methergine 0.2 mg IM given to help with uterotonicity.  EBL since delivery about 800 ml so far; will check CBC and DIC panel now.  Patient noted to be hypotensive, LR 500 ml bolus ordered for now.  Will continue close observation for at least about 4 hours, and may consider discharge to home later if patient is stable.    Jaynie Collins, MD, FACOG Obstetrician & Gynecologist, [facility] for [employer], [facility]

## 2018-10-06 NOTE — Progress Notes (Signed)
Faculty Practice OB/GYN Attending Note  Patient with minimal bleeding now, no other immediate complications. CBC Latest Ref Rng & Units 10/06/2018 10/05/2018  WBC 4.0 - 10.5 K/uL 6.1 4.3  Hemoglobin 12.0 - 15.0 g/dL 8.8(K) 11.3(L)  Hematocrit 36.0 - 46.0 % 25.5(L) 33.5(L)  Platelets 150 - 400 K/uL 160 171  Doxycycline 200 mg po x 1 ordered given uterine manual evacuation for infection prophylaxis; Feraheme 510 mg IV x 1 also ordered for anemia to be given prior to discharge.  If patient is stable, hope to discharge to home around 1500 today with plans for outpatient follow up; message already sent to the office to schedule these appointments.  Jaynie Collins, MD, FACOG Obstetrician & Gynecologist, [facility] for [employer], [facility]

## 2018-10-07 ENCOUNTER — Encounter: Payer: BLUE CROSS/BLUE SHIELD | Admitting: Obstetrics and Gynecology

## 2018-10-09 ENCOUNTER — Telehealth: Payer: Self-pay | Admitting: *Deleted

## 2018-10-09 ENCOUNTER — Telehealth: Payer: Self-pay | Admitting: Family Medicine

## 2018-10-09 NOTE — Telephone Encounter (Signed)
Pt's husband calling to schedule appointment for pt for follow up of a miscarriage last week and they are reporting some complication.  Requesting a call to schedule that appointment.

## 2018-10-09 NOTE — Telephone Encounter (Signed)
Attempted to call patient with interpreter. Had to leave a VM.

## 2018-10-15 ENCOUNTER — Encounter: Payer: Self-pay | Admitting: Family Medicine

## 2018-10-16 NOTE — BH Specialist Note (Signed)
Attempted to complete Webex visit with patient.   Contacted patient through [employer], via [employer], [phone]); Left HIPPA-compliant message to call back Asher Muir from [facility] for [employer] at [phone].    Integrated Behavioral Health Initial Visit  MRN: [id number] Name: Velveeta Blann [facility], LCSW

## 2018-10-17 ENCOUNTER — Ambulatory Visit: Payer: BLUE CROSS/BLUE SHIELD | Admitting: Clinical

## 2018-10-17 ENCOUNTER — Other Ambulatory Visit: Payer: Self-pay

## 2018-10-17 ENCOUNTER — Ambulatory Visit: Payer: BLUE CROSS/BLUE SHIELD | Admitting: Obstetrics & Gynecology

## 2018-10-17 DIAGNOSIS — Z3A19 19 weeks gestation of pregnancy: Secondary | ICD-10-CM

## 2018-10-17 NOTE — Progress Notes (Signed)
Called pt using assigned interpreter, no answer, intepreter left message, will attempt later.   2nd attempt, wife not home,asked husband using assigned Interpreter to have pt to call back to reschedule.

## 2018-10-21 ENCOUNTER — Encounter: Payer: BLUE CROSS/BLUE SHIELD | Admitting: Obstetrics & Gynecology

## 2018-10-23 ENCOUNTER — Encounter: Payer: BLUE CROSS/BLUE SHIELD | Admitting: Obstetrics & Gynecology

## 2018-11-14 ENCOUNTER — Telehealth: Payer: Self-pay | Admitting: Family Medicine

## 2018-11-14 MED ORDER — NORGESTIMATE-ETH ESTRADIOL 0.25-35 MG-MCG PO TABS: 1.0000 | ORAL_TABLET | Freq: Every day | ORAL | 11 refills | Status: DC

## 2018-11-14 NOTE — Telephone Encounter (Signed)
Patient is requesting Birth Control Pills

## 2018-11-14 NOTE — Telephone Encounter (Addendum)
Called pt with [facility] Interpreter # [phone] and pt requests to have BCP prescribed.  Pt denies having sex since delivery on 10/06/18.  Pt also states that she does not have a history of HTN or DVT.  Per Dr. Alysia Penna, pt can be prescribed BCP x one year.  I explained to the pt to start taking the pill on Sunday and to make sure that she takes the medication at the same everyday.  I also advised the pt that we will expect her to f/u in a year.  Pt verbalized understanding.

## 2019-01-24 ENCOUNTER — Telehealth: Payer: Self-pay

## 2019-01-24 NOTE — Telephone Encounter (Signed)
Pt's husband called requesting a call back in regards to having miscarriages.

## 2019-02-05 ENCOUNTER — Telehealth: Payer: Self-pay | Admitting: Obstetrics & Gynecology

## 2019-02-05 NOTE — Telephone Encounter (Signed)
I called Kathy Wilkerson with [employer] [phone] and informed her I am returning her call. I asked what questions she had . She states she delivered a baby early 1.5 years ago; then she has a 19 week loss earlier this year. She wants to know why she keeps having miscarriages. I explained we do not always know why miscarriages happen; but that would be a discussion she should have with a doctor. I asked if she is planning to have other children and she said yes. I explained I would have registrars call her with an appointment and it may be a few weeks away. She voices understanding. ( per chart she did have a fu after the loss in April). [name],RN

## 2019-02-05 NOTE — Telephone Encounter (Signed)
Spoke with patient w/ Arabic interpreter ID# [phone] to get patient scheduled with a provider to take about miscarriage concerns and future pregnancies. Patient was scheduled for Virtual visit on 9/14 @ 1:15. Patient downloaded the [facility] app while we were on the phone.

## 2019-02-28 ENCOUNTER — Telehealth: Payer: Self-pay | Admitting: Obstetrics & Gynecology

## 2019-02-28 NOTE — Telephone Encounter (Signed)
Called the patient to inform of the upcoming appointment. The patient verbalized understanding. °

## 2019-03-03 ENCOUNTER — Ambulatory Visit: Payer: BLUE CROSS/BLUE SHIELD | Admitting: Obstetrics & Gynecology

## 2019-03-03 ENCOUNTER — Encounter: Payer: Self-pay | Admitting: Obstetrics & Gynecology

## 2019-05-29 ENCOUNTER — Telehealth: Payer: Self-pay | Admitting: Obstetrics and Gynecology

## 2019-05-29 LAB — PREGNANCY, URINE

## 2019-05-29 NOTE — Telephone Encounter (Signed)
Fax sent in from [facility] Department requesting the patient have a new ob intake prior to the appointment scheduled for 06/24/2018.  Called the patient with the interpreter id [phone] and left a message stating she has an appointment scheduled for 06/02/2019. If you have any questions please give [place] a call back at [phone].

## 2019-06-02 ENCOUNTER — Ambulatory Visit (INDEPENDENT_AMBULATORY_CARE_PROVIDER_SITE_OTHER): Payer: Self-pay | Admitting: *Deleted

## 2019-06-02 ENCOUNTER — Other Ambulatory Visit: Payer: Self-pay

## 2019-06-02 ENCOUNTER — Encounter: Payer: Self-pay | Admitting: *Deleted

## 2019-06-02 DIAGNOSIS — O09219 Supervision of pregnancy with history of pre-term labor, unspecified trimester: Secondary | ICD-10-CM

## 2019-06-02 DIAGNOSIS — O039 Complete or unspecified spontaneous abortion without complication: Secondary | ICD-10-CM

## 2019-06-02 DIAGNOSIS — Z789 Other specified health status: Secondary | ICD-10-CM

## 2019-06-02 DIAGNOSIS — Z603 Acculturation difficulty: Secondary | ICD-10-CM

## 2019-06-02 DIAGNOSIS — O0990 Supervision of high risk pregnancy, unspecified, unspecified trimester: Secondary | ICD-10-CM

## 2019-06-02 NOTE — Progress Notes (Signed)
I connected with  Cerise Klawitter on 06/02/19 at  9:30 AM EST by telephone and verified that I am speaking with the correct person using two identifiers.   I discussed the limitations, risks, security and privacy concerns of performing an evaluation and management service by telephone and the availability of in person appointments. I also discussed with the patient that there may be a patient responsible charge related to this service. The patient expressed understanding and agreed to proceed. Explained I am completing her New OB Intake today. We discussed Her EDD and that it is based on  sure LMP . I reviewed her allergies, meds, OB History, Medical /Surgical history, and appropriate screenings. I explained we will send a blood pressure cuff to [facility] once her medicaid is approved and active ( she has applied).  Explained  then we will have her take her blood pressure weekly.I explained she will have some visits in office and some virtually. I reviewed her  appointment date/ time with her , our location and to wear mask, no visitors. Explained she will have exam, ob bloodwork, hemoglobin a1C, cbg , genetic testing if desired, pap if needed. I scheduled an [place] at 19 weeks and gave her the appointment. I also informed her of [facility] services if needed. She voices understanding.  Mecca Barga,RN 06/02/2019  9:30 AM

## 2019-06-02 NOTE — Patient Instructions (Signed)

## 2019-06-02 NOTE — Progress Notes (Signed)
Patient seen and assessed by nursing staff during this encounter. I have reviewed the chart and agree with the documentation and plan.  Mora Bellman, MD 06/02/2019 11:01 AM

## 2019-06-03 ENCOUNTER — Encounter: Payer: Self-pay | Admitting: *Deleted

## 2019-06-25 ENCOUNTER — Encounter: Payer: Self-pay | Admitting: Obstetrics and Gynecology

## 2019-06-25 ENCOUNTER — Ambulatory Visit (INDEPENDENT_AMBULATORY_CARE_PROVIDER_SITE_OTHER): Payer: BLUE CROSS/BLUE SHIELD | Admitting: Obstetrics and Gynecology

## 2019-06-25 ENCOUNTER — Other Ambulatory Visit: Payer: Self-pay

## 2019-06-25 VITALS — BP 112/74 | HR 84 | Wt 119.0 lb

## 2019-06-25 DIAGNOSIS — Z3A10 10 weeks gestation of pregnancy: Secondary | ICD-10-CM

## 2019-06-25 DIAGNOSIS — Z789 Other specified health status: Secondary | ICD-10-CM

## 2019-06-25 DIAGNOSIS — O09299 Supervision of pregnancy with other poor reproductive or obstetric history, unspecified trimester: Secondary | ICD-10-CM

## 2019-06-25 DIAGNOSIS — Z113 Encounter for screening for infections with a predominantly sexual mode of transmission: Secondary | ICD-10-CM | POA: Diagnosis not present

## 2019-06-25 DIAGNOSIS — Z124 Encounter for screening for malignant neoplasm of cervix: Secondary | ICD-10-CM | POA: Diagnosis not present

## 2019-06-25 DIAGNOSIS — O0990 Supervision of high risk pregnancy, unspecified, unspecified trimester: Secondary | ICD-10-CM

## 2019-06-25 DIAGNOSIS — O09211 Supervision of pregnancy with history of pre-term labor, first trimester: Secondary | ICD-10-CM

## 2019-06-25 DIAGNOSIS — O09291 Supervision of pregnancy with other poor reproductive or obstetric history, first trimester: Secondary | ICD-10-CM

## 2019-06-25 DIAGNOSIS — O09219 Supervision of pregnancy with history of pre-term labor, unspecified trimester: Secondary | ICD-10-CM

## 2019-06-25 DIAGNOSIS — O0991 Supervision of high risk pregnancy, unspecified, first trimester: Secondary | ICD-10-CM

## 2019-06-25 NOTE — Progress Notes (Signed)
Subjective:    Kathy Wilkerson is a G3P0110 [redacted]w[redacted]d being seen today for her first obstetrical visit.  Her obstetrical history is significant for previous PPROM at 29 weeks with neonatal demise and history of incompetent cervix with fetal loss at 19 weeks. Patient does intend to breast feed. Pregnancy history fully reviewed.  Patient reports no complaints.  Vitals:   06/25/19 1021  BP: 112/74  Pulse: 84  Weight: 119 lb (54 kg)    HISTORY: OB History  Gravida Para Term Preterm AB Living  3 1   1 1  0  SAB TAB Ectopic Multiple Live Births  1       1    # Outcome Date GA Lbr Len/2nd Weight Sex Delivery Anes PTL Lv  3 Current           2 SAB 10/06/18 [redacted]w[redacted]d   F  None  FD     Birth Comments: Placenta delivered around 1030, large retroplacental clots, EBL 800 ml     Complications: Other Excessive Bleeding  1 Preterm 11/05/17 [redacted]w[redacted]d  4 lb 6.6 oz (2 kg) F Vag-Spont  N ND     Birth Comments: bleeding   Past Medical History:  Diagnosis Date  . Medical history non-contributory   . Preterm labor    Past Surgical History:  Procedure Laterality Date  . NO PAST SURGERIES     Family History  Problem Relation Age of Onset  . Healthy Mother   . Diabetes Father   . Heart disease Father   . Hypertension Father      Exam    Uterus:   10-week size  Pelvic Exam:    Perineum: Normal Perineum   Vulva: normal   Vagina:  normal mucosa, normal discharge   pH:    Cervix: multiparous appearance and cervix is closed and long   Adnexa: no mass, fullness, tenderness   Bony Pelvis: gynecoid  System: Breast:  normal appearance, no masses or tenderness   Skin: normal coloration and turgor, no rashes    Neurologic: oriented, no focal deficits   Extremities: normal strength, tone, and muscle mass   HEENT extra ocular movement intact   Mouth/Teeth mucous membranes moist, pharynx normal without lesions and dental hygiene good   Neck supple and no masses   Cardiovascular: regular rate and  rhythm   Respiratory:  appears well, vitals normal, no respiratory distress, acyanotic, normal RR, chest clear, no wheezing, crepitations, rhonchi, normal symmetric air entry   Abdomen: soft, non-tender; bowel sounds normal; no masses,  no organomegaly   Urinary:       Assessment:    Pregnancy: [redacted]w[redacted]d Patient Active Problem List   Diagnosis Date Noted  . Supervision of high risk pregnancy, antepartum 06/02/2019  . History of incompetent cervix, currently pregnant 10/06/2018  . Previous preterm delivery, antepartum 08/21/2018  . Language barrier 08/21/2018        Plan:     Initial labs drawn. Prenatal vitamins. Problem list reviewed and updated. Genetic Screening discussed : Panorama undecided.  Ultrasound discussed; fetal survey: ordered. Patient with history of incompetent cervix. Discussed prophylactic cerclage placement starting at 12 weeks. Risks, benefits and alternatives were explained including but not limited to risks of bleeding, infection, accidental rupture of membrane. It was also explained that cerclage placement will not decrease the incidence of PPROM. Patient verbalized understanding and desires proceed  Follow up in 4 weeks. 50% of 30 min visit spent on counseling and coordination of care.  Clella Mckeel 06/25/2019

## 2019-06-25 NOTE — Patient Instructions (Addendum)
Cervical Cerclage  Cervical cerclage is a surgical procedure to correct a cervix that opens up and thins out before pregnancy is at term. This is also called cervical insufficiency, or incompetent cervix. This condition can cause labor to start early (prematurely). In this procedure, a health care provider uses stitches (sutures) to sew the cervix shut during pregnancy. Your health care provider may use ultrasound to help guide the procedure and monitor your baby. Ultrasound uses sound waves to take images of your cervix and uterus. The health care provider will assess these images on a monitor in the operating room. Tell a health care provider about:  Any allergies you have, especially any allergies related to prescribed medicine, stitches, or anesthetic medicines.  Any problems you or family members have had with anesthetic medicines.  Any blood disorders you have.  Any surgeries you have had, including prior cervical stitching.  Any medical conditions you have or have had. What are the risks? Generally, this is a safe procedure. However, problems may occur, including:  Infection, such as infection of the cervix or the bag of fluid that surrounds the baby (amniotic sac).  Vaginal bleeding.  Allergic reactions to medicines.  Damage to nearby structures or organs, such as injury to the cervix or tearing of the amniotic sac.  Contractions that come too early, including going into early labor and delivery.  Cervical dystocia. This occurs when the cervix is unable to open normally during labor. What happens before the procedure? Staying hydrated Follow instructions from your health care provider about hydration, which may include:  Up to 2 hours before the procedure - you may continue to drink clear liquids, such as water, clear fruit juice, black coffee, and plain tea.  Eating and drinking restrictions Follow instructions from your health care provider about eating and drinking,  which may include:  8 hours before the procedure - stop eating heavy meals or foods, such as meat, fried foods, or fatty foods.  6 hours before the procedure - stop eating light meals or foods, such as toast or cereal.  6 hours before the procedure - stop drinking milk or drinks that contain milk.  2 hours before the procedure - stop drinking clear liquids. Medicines Ask your health care provider about:  Changing or stopping your regular medicines. This is especially important if you are taking diabetes medicines or blood thinners.  Taking medicines such as aspirin and ibuprofen. These medicines can thin your blood. Do not take these medicines unless your health care provider tells you to take them.  Taking over-the-counter medicines, vitamins, herbs, and supplements. Surgery safety Ask your health care provider:  How your surgery site will be marked.  What steps will be taken to help prevent infection. These may include: ? Removing hair at the surgery site. ? Washing skin with a germ-killing soap. ? Taking antibiotic medicine. General instructions  Do not put on any lotion, deodorant, or perfume.  Remove contact lenses and jewelry.  You may have an exam or testing, including blood or urine tests.  Plan to have someone take you home from the hospital or clinic.  If you will be going home right after the procedure, plan to have someone with you for 24 hours. What happens during the procedure?  An IV will be inserted into one of your veins.  You may be given one or more of the following: ? A medicine to help you relax (sedative). ? A medicine to numb the area (local anesthetic). ?   A medicine to make you fall asleep (general anesthetic). ? A medicine that is injected into your spine to numb the area below and slightly above the injection site (spinal anesthetic).  A lubricated instrument (speculum) will be inserted into your vagina. The speculum will be widened to open the  walls of your vagina so your surgeon can see your cervix.  Your cervix will be grasped and tightly sutured to close it. To do this, your surgeon will stitch a strong band of thread around your cervix, then the thread will be tightened to hold your cervix shut. The procedure may vary among health care providers and hospitals. What happens after the procedure?  Your blood pressure, heart rate, breathing rate, and blood oxygen level will be monitored until you leave the hospital or clinic.  You will be monitored for premature contractions.  You may have light bleeding and mild cramping.  You may have to wear compression stockings. These stockings help to prevent blood clots and reduce swelling in your legs.  If you were given a sedative during the procedure, it can affect you for several hours. Do not drive or operate machinery until your health care provider says that it is safe.  You may be put on bed rest.  You may be given an injection of a hormone (progesterone) to prevent premature contractions. Summary  Cervical cerclage is a surgical procedure in which stitches are used to sew the cervix shut during pregnancy.  Before the procedure, tell your health care provider about your medicines, or medical problems or blood disorders that you have.  This is a safe procedure. However, problems may occur, including infection, bleeding, or premature labor.  Follow all instructions about eating and drinking before the procedure. Plan to have someone drive you home from the hospital or clinic. This information is not intended to replace advice given to you by your health care provider. Make sure you discuss any questions you have with your health care provider. Document Revised: 04/01/2019 Document Reviewed: 01/29/2019 Elsevier Patient Education  [place] of Pregnancy The first trimester of pregnancy is from week 1 until the end of week 13 (months 1 through 3). A  week after a sperm fertilizes an egg, the egg will implant on the wall of the uterus. This embryo will begin to develop into a baby. Genes from you and your partner will form the baby. The female genes will determine whether the baby will be a boy or a girl. At 6-8 weeks, the eyes and face will be formed, and the heartbeat can be seen on ultrasound. At the end of 12 weeks, all the baby's organs will be formed. Now that you are pregnant, you will want to do everything you can to have a healthy baby. Two of the most important things are to get good prenatal care and to follow your health care provider's instructions. Prenatal care is all the medical care you receive before the baby's birth. This care will help prevent, find, and treat any problems during the pregnancy and childbirth. Body changes during your first trimester Your body goes through many changes during pregnancy. The changes vary from woman to woman.  You may gain or lose a couple of pounds at first.  You may feel sick to your stomach (nauseous) and you may throw up (vomit). If the vomiting is uncontrollable, call your health care provider.  You may tire easily.  You may develop headaches that can be relieved  by medicines. All medicines should be approved by your health care provider.  You may urinate more often. Painful urination may mean you have a bladder infection.  You may develop heartburn as a result of your pregnancy.  You may develop constipation because certain hormones are causing the muscles that push stool through your intestines to slow down.  You may develop hemorrhoids or swollen veins (varicose veins).  Your breasts may begin to grow larger and become tender. Your nipples may stick out more, and the tissue that surrounds them (areola) may become darker.  Your gums may bleed and may be sensitive to brushing and flossing.  Dark spots or blotches (chloasma, mask of pregnancy) may develop on your face. This will likely  fade after the baby is born.  Your menstrual periods will stop.  You may have a loss of appetite.  You may develop cravings for certain kinds of food.  You may have changes in your emotions from day to day, such as being excited to be pregnant or being concerned that something may go wrong with the pregnancy and baby.  You may have more vivid and strange dreams.  You may have changes in your hair. These can include thickening of your hair, rapid growth, and changes in texture. Some women also have hair loss during or after pregnancy, or hair that feels dry or thin. Your hair will most likely return to normal after your baby is born. What to expect at prenatal visits During a routine prenatal visit:  You will be weighed to make sure you and the baby are growing normally.  Your blood pressure will be taken.  Your abdomen will be measured to track your baby's growth.  The fetal heartbeat will be listened to between weeks 10 and 14 of your pregnancy.  Test results from any previous visits will be discussed. Your health care provider may ask you:  How you are feeling.  If you are feeling the baby move.  If you have had any abnormal symptoms, such as leaking fluid, bleeding, severe headaches, or abdominal cramping.  If you are using any tobacco products, including cigarettes, chewing tobacco, and electronic cigarettes.  If you have any questions. Other tests that may be performed during your first trimester include:  Blood tests to find your blood type and to check for the presence of any previous infections. The tests will also be used to check for low iron levels (anemia) and protein on red blood cells (Rh antibodies). Depending on your risk factors, or if you previously had diabetes during pregnancy, you may have tests to check for high blood sugar that affects pregnant women (gestational diabetes).  Urine tests to check for infections, diabetes, or protein in the urine.  An  ultrasound to confirm the proper growth and development of the baby.  Fetal screens for spinal cord problems (spina bifida) and Down syndrome.  HIV (human immunodeficiency virus) testing. Routine prenatal testing includes screening for HIV, unless you choose not to have this test.  You may need other tests to make sure you and the baby are doing well. Follow these instructions at home: Medicines  Follow your health care provider's instructions regarding medicine use. Specific medicines may be either safe or unsafe to take during pregnancy.  Take a prenatal vitamin that contains at least 600 micrograms (mcg) of folic acid.  If you develop constipation, try taking a stool softener if your health care provider approves. Eating and drinking   Eat a balanced  diet that includes fresh fruits and vegetables, whole grains, good sources of protein such as meat, eggs, or tofu, and low-fat dairy. Your health care provider will help you determine the amount of weight gain that is right for you.  Avoid raw meat and uncooked cheese. These carry germs that can cause birth defects in the baby.  Eating four or five small meals rather than three large meals a day may help relieve nausea and vomiting. If you start to feel nauseous, eating a few soda crackers can be helpful. Drinking liquids between meals, instead of during meals, also seems to help ease nausea and vomiting.  Limit foods that are high in fat and processed sugars, such as fried and sweet foods.  To prevent constipation: ? Eat foods that are high in fiber, such as fresh fruits and vegetables, whole grains, and beans. ? Drink enough fluid to keep your urine clear or pale yellow. Activity  Exercise only as directed by your health care provider. Most women can continue their usual exercise routine during pregnancy. Try to exercise for 30 minutes at least 5 days a week. Exercising will help you: ? Control your weight. ? Stay in shape. ? Be  prepared for labor and delivery.  Experiencing pain or cramping in the lower abdomen or lower back is a good sign that you should stop exercising. Check with your health care provider before continuing with normal exercises.  Try to avoid standing for long periods of time. Move your legs often if you must stand in one place for a long time.  Avoid heavy lifting.  Wear low-heeled shoes and practice good posture.  You may continue to have sex unless your health care provider tells you not to. Relieving pain and discomfort  Wear a good support bra to relieve breast tenderness.  Take warm sitz baths to soothe any pain or discomfort caused by hemorrhoids. Use hemorrhoid cream if your health care provider approves.  Rest with your legs elevated if you have leg cramps or low back pain.  If you develop varicose veins in your legs, wear support hose. Elevate your feet for 15 minutes, 3-4 times a day. Limit salt in your diet. Prenatal care  Schedule your prenatal visits by the twelfth week of pregnancy. They are usually scheduled monthly at first, then more often in the last 2 months before delivery.  Write down your questions. Take them to your prenatal visits.  Keep all your prenatal visits as told by your health care provider. This is important. Safety  Wear your seat belt at all times when driving.  Make a list of emergency phone numbers, including numbers for family, friends, the hospital, and police and fire departments. General instructions  Ask your health care provider for a referral to a local prenatal education class. Begin classes no later than the beginning of month 6 of your pregnancy.  Ask for help if you have counseling or nutritional needs during pregnancy. Your health care provider can offer advice or refer you to specialists for help with various needs.  Do not use hot tubs, steam rooms, or saunas.  Do not douche or use tampons or scented sanitary pads.  Do not  cross your legs for long periods of time.  Avoid cat litter boxes and soil used by cats. These carry germs that can cause birth defects in the baby and possibly loss of the fetus by miscarriage or stillbirth.  Avoid all smoking, herbs, alcohol, and medicines not prescribed by your  health care provider. Chemicals in these products affect the formation and growth of the baby.  Do not use any products that contain nicotine or tobacco, such as cigarettes and e-cigarettes. If you need help quitting, ask your health care provider. You may receive counseling support and other resources to help you quit.  Schedule a dentist appointment. At home, brush your teeth with a soft toothbrush and be gentle when you floss. Contact a health care provider if:  You have dizziness.  You have mild pelvic cramps, pelvic pressure, or nagging pain in the abdominal area.  You have persistent nausea, vomiting, or diarrhea.  You have a bad smelling vaginal discharge.  You have pain when you urinate.  You notice increased swelling in your face, hands, legs, or ankles.  You are exposed to fifth disease or chickenpox.  You are exposed to [place] measles (rubella) and have never had it. Get help right away if:  You have a fever.  You are leaking fluid from your vagina.  You have spotting or bleeding from your vagina.  You have severe abdominal cramping or pain.  You have rapid weight gain or loss.  You vomit blood or material that looks like coffee grounds.  You develop a severe headache.  You have shortness of breath.  You have any kind of trauma, such as from a fall or a car accident. Summary  The first trimester of pregnancy is from week 1 until the end of week 13 (months 1 through 3).  Your body goes through many changes during pregnancy. The changes vary from woman to woman.  You will have routine prenatal visits. During those visits, your health care provider will examine you, discuss any  test results you may have, and talk with you about how you are feeling. This information is not intended to replace advice given to you by your health care provider. Make sure you discuss any questions you have with your health care provider. Document Revised: 05/18/2017 Document Reviewed: 05/17/2016 Elsevier Patient Education  2020 [employer].   Second Trimester of Pregnancy The second trimester is from week 14 through week 27 (months 4 through 6). The second trimester is often a time when you feel your best. Your body has adjusted to being pregnant, and you begin to feel better physically. Usually, morning sickness has lessened or quit completely, you may have more energy, and you may have an increase in appetite. The second trimester is also a time when the fetus is growing rapidly. At the end of the sixth month, the fetus is about 9 inches long and weighs about 1 pounds. You will likely begin to feel the baby move (quickening) between 16 and 20 weeks of pregnancy. Body changes during your second trimester Your body continues to go through many changes during your second trimester. The changes vary from woman to woman.  Your weight will continue to increase. You will notice your lower abdomen bulging out.  You may begin to get stretch marks on your hips, abdomen, and breasts.  You may develop headaches that can be relieved by medicines. The medicines should be approved by your health care provider.  You may urinate more often because the fetus is pressing on your bladder.  You may develop or continue to have heartburn as a result of your pregnancy.  You may develop constipation because certain hormones are causing the muscles that push waste through your intestines to slow down.  You may develop hemorrhoids or swollen, bulging veins (  varicose veins).  You may have back pain. This is caused by: ? Weight gain. ? Pregnancy hormones that are relaxing the joints in your pelvis. ? A  shift in weight and the muscles that support your balance.  Your breasts will continue to grow and they will continue to become tender.  Your gums may bleed and may be sensitive to brushing and flossing.  Dark spots or blotches (chloasma, mask of pregnancy) may develop on your face. This will likely fade after the baby is born.  A dark line from your belly button to the pubic area (linea nigra) may appear. This will likely fade after the baby is born.  You may have changes in your hair. These can include thickening of your hair, rapid growth, and changes in texture. Some women also have hair loss during or after pregnancy, or hair that feels dry or thin. Your hair will most likely return to normal after your baby is born. What to expect at prenatal visits During a routine prenatal visit:  You will be weighed to make sure you and the fetus are growing normally.  Your blood pressure will be taken.  Your abdomen will be measured to track your baby's growth.  The fetal heartbeat will be listened to.  Any test results from the previous visit will be discussed. Your health care provider may ask you:  How you are feeling.  If you are feeling the baby move.  If you have had any abnormal symptoms, such as leaking fluid, bleeding, severe headaches, or abdominal cramping.  If you are using any tobacco products, including cigarettes, chewing tobacco, and electronic cigarettes.  If you have any questions. Other tests that may be performed during your second trimester include:  Blood tests that check for: ? Low iron levels (anemia). ? High blood sugar that affects pregnant women (gestational diabetes) between 52 and 28 weeks. ? Rh antibodies. This is to check for a protein on red blood cells (Rh factor).  Urine tests to check for infections, diabetes, or protein in the urine.  An ultrasound to confirm the proper growth and development of the baby.  An amniocentesis to check for  possible genetic problems.  Fetal screens for spina bifida and Down syndrome.  HIV (human immunodeficiency virus) testing. Routine prenatal testing includes screening for HIV, unless you choose not to have this test. Follow these instructions at home: Medicines  Follow your health care provider's instructions regarding medicine use. Specific medicines may be either safe or unsafe to take during pregnancy.  Take a prenatal vitamin that contains at least 600 micrograms (mcg) of folic acid.  If you develop constipation, try taking a stool softener if your health care provider approves. Eating and drinking   Eat a balanced diet that includes fresh fruits and vegetables, whole grains, good sources of protein such as meat, eggs, or tofu, and low-fat dairy. Your health care provider will help you determine the amount of weight gain that is right for you.  Avoid raw meat and uncooked cheese. These carry germs that can cause birth defects in the baby.  If you have low calcium intake from food, talk to your health care provider about whether you should take a daily calcium supplement.  Limit foods that are high in fat and processed sugars, such as fried and sweet foods.  To prevent constipation: ? Drink enough fluid to keep your urine clear or pale yellow. ? Eat foods that are high in fiber, such as fresh fruits  and vegetables, whole grains, and beans. Activity  Exercise only as directed by your health care provider. Most women can continue their usual exercise routine during pregnancy. Try to exercise for 30 minutes at least 5 days a week. Stop exercising if you experience uterine contractions.  Avoid heavy lifting, wear low heel shoes, and practice good posture.  A sexual relationship may be continued unless your health care provider directs you otherwise. Relieving pain and discomfort  Wear a good support bra to prevent discomfort from breast tenderness.  Take warm sitz baths to soothe  any pain or discomfort caused by hemorrhoids. Use hemorrhoid cream if your health care provider approves.  Rest with your legs elevated if you have leg cramps or low back pain.  If you develop varicose veins, wear support hose. Elevate your feet for 15 minutes, 3-4 times a day. Limit salt in your diet. Prenatal Care  Write down your questions. Take them to your prenatal visits.  Keep all your prenatal visits as told by your health care provider. This is important. Safety  Wear your seat belt at all times when driving.  Make a list of emergency phone numbers, including numbers for family, friends, the hospital, and police and fire departments. General instructions  Ask your health care provider for a referral to a local prenatal education class. Begin classes no later than the beginning of month 6 of your pregnancy.  Ask for help if you have counseling or nutritional needs during pregnancy. Your health care provider can offer advice or refer you to specialists for help with various needs.  Do not use hot tubs, steam rooms, or saunas.  Do not douche or use tampons or scented sanitary pads.  Do not cross your legs for long periods of time.  Avoid cat litter boxes and soil used by cats. These carry germs that can cause birth defects in the baby and possibly loss of the fetus by miscarriage or stillbirth.  Avoid all smoking, herbs, alcohol, and unprescribed drugs. Chemicals in these products can affect the formation and growth of the baby.  Do not use any products that contain nicotine or tobacco, such as cigarettes and e-cigarettes. If you need help quitting, ask your health care provider.  Visit your dentist if you have not gone yet during your pregnancy. Use a soft toothbrush to brush your teeth and be gentle when you floss. Contact a health care provider if:  You have dizziness.  You have mild pelvic cramps, pelvic pressure, or nagging pain in the abdominal area.  You have  persistent nausea, vomiting, or diarrhea.  You have a bad smelling vaginal discharge.  You have pain when you urinate. Get help right away if:  You have a fever.  You are leaking fluid from your vagina.  You have spotting or bleeding from your vagina.  You have severe abdominal cramping or pain.  You have rapid weight gain or weight loss.  You have shortness of breath with chest pain.  You notice sudden or extreme swelling of your face, hands, ankles, feet, or legs.  You have not felt your baby move in over an hour.  You have severe headaches that do not go away when you take medicine.  You have vision changes. Summary  The second trimester is from week 14 through week 27 (months 4 through 6). It is also a time when the fetus is growing rapidly.  Your body goes through many changes during pregnancy. The changes vary from woman to  woman.  Avoid all smoking, herbs, alcohol, and unprescribed drugs. These chemicals affect the formation and growth your baby.  Do not use any tobacco products, such as cigarettes, chewing tobacco, and e-cigarettes. If you need help quitting, ask your health care provider.  Contact your health care provider if you have any questions. Keep all prenatal visits as told by your health care provider. This is important. This information is not intended to replace advice given to you by your health care provider. Make sure you discuss any questions you have with your health care provider. Document Revised: 09/27/2018 Document Reviewed: 07/11/2016 Elsevier Patient Education  2020 [employer].   Contraception Choices Contraception, also called birth control, refers to methods or devices that prevent pregnancy. Hormonal methods Contraceptive implant  A contraceptive implant is a thin, plastic tube that contains a hormone. It is inserted into the upper part of the arm. It can remain in place for up to 3 years. Progestin-only injections Progestin-only  injections are injections of progestin, a synthetic form of the hormone progesterone. They are given every 3 months by a health care provider. Birth control pills  Birth control pills are pills that contain hormones that prevent pregnancy. They must be taken once a day, preferably at the same time each day. Birth control patch  The birth control patch contains hormones that prevent pregnancy. It is placed on the skin and must be changed once a week for three weeks and removed on the fourth week. A prescription is needed to use this method of contraception. Vaginal ring  A vaginal ring contains hormones that prevent pregnancy. It is placed in the vagina for three weeks and removed on the fourth week. After that, the process is repeated with a new ring. A prescription is needed to use this method of contraception. Emergency contraceptive Emergency contraceptives prevent pregnancy after unprotected sex. They come in pill form and can be taken up to 5 days after sex. They work best the sooner they are taken after having sex. Most emergency contraceptives are available without a prescription. This method should not be used as your only form of birth control. Barrier methods Female condom  A female condom is a thin sheath that is worn over the penis during sex. Condoms keep sperm from going inside a woman's body. They can be used with a spermicide to increase their effectiveness. They should be disposed after a single use. Female condom  A female condom is a soft, loose-fitting sheath that is put into the vagina before sex. The condom keeps sperm from going inside a woman's body. They should be disposed after a single use. Diaphragm  A diaphragm is a soft, dome-shaped barrier. It is inserted into the vagina before sex, along with a spermicide. The diaphragm blocks sperm from entering the uterus, and the spermicide kills sperm. A diaphragm should be left in the vagina for 6-8 hours after sex and removed  within 24 hours. A diaphragm is prescribed and fitted by a health care provider. A diaphragm should be replaced every 1-2 years, after giving birth, after gaining more than 15 lb (6.8 kg), and after pelvic surgery. Cervical cap  A cervical cap is a round, soft latex or plastic cup that fits over the cervix. It is inserted into the vagina before sex, along with spermicide. It blocks sperm from entering the uterus. The cap should be left in place for 6-8 hours after sex and removed within 48 hours. A cervical cap must be prescribed and  fitted by a health care provider. It should be replaced every 2 years. Sponge  A sponge is a soft, circular piece of polyurethane foam with spermicide on it. The sponge helps block sperm from entering the uterus, and the spermicide kills sperm. To use it, you make it wet and then insert it into the vagina. It should be inserted before sex, left in for at least 6 hours after sex, and removed and thrown away within 30 hours. Spermicides Spermicides are chemicals that kill or block sperm from entering the cervix and uterus. They can come as a cream, jelly, suppository, foam, or tablet. A spermicide should be inserted into the vagina with an applicator at least 10-15 minutes before sex to allow time for it to work. The process must be repeated every time you have sex. Spermicides do not require a prescription. Intrauterine contraception Intrauterine device (IUD) An IUD is a T-shaped device that is put in a woman's uterus. There are two types:  Hormone IUD.This type contains progestin, a synthetic form of the hormone progesterone. This type can stay in place for 3-5 years.  Copper IUD.This type is wrapped in copper wire. It can stay in place for 10 years.  Permanent methods of contraception Female tubal ligation In this method, a woman's fallopian tubes are sealed, tied, or blocked during surgery to prevent eggs from traveling to the uterus. Hysteroscopic  sterilization In this method, a small, flexible insert is placed into each fallopian tube. The inserts cause scar tissue to form in the fallopian tubes and block them, so sperm cannot reach an egg. The procedure takes about 3 months to be effective. Another form of birth control must be used during those 3 months. Female sterilization This is a procedure to tie off the tubes that carry sperm (vasectomy). After the procedure, the man can still ejaculate fluid (semen). Natural planning methods Natural family planning In this method, a couple does not have sex on days when the woman could become pregnant. Calendar method This means keeping track of the length of each menstrual cycle, identifying the days when pregnancy can happen, and not having sex on those days. Ovulation method In this method, a couple avoids sex during ovulation. Symptothermal method This method involves not having sex during ovulation. The woman typically checks for ovulation by watching changes in her temperature and in the consistency of cervical mucus. Post-ovulation method In this method, a couple waits to have sex until after ovulation. Summary  Contraception, also called birth control, means methods or devices that prevent pregnancy.  Hormonal methods of contraception include implants, injections, pills, patches, vaginal rings, and emergency contraceptives.  Barrier methods of contraception can include female condoms, female condoms, diaphragms, cervical caps, sponges, and spermicides.  There are two types of IUDs (intrauterine devices). An IUD can be put in a woman's uterus to prevent pregnancy for 3-5 years.  Permanent sterilization can be done through a procedure for males, females, or both.  Natural family planning methods involve not having sex on days when the woman could become pregnant. This information is not intended to replace advice given to you by your health care provider. Make sure you discuss any  questions you have with your health care provider. Document Revised: 06/07/2017 Document Reviewed: 07/08/2016 Elsevier Patient Education  2020 [employer].   Breastfeeding  Choosing to breastfeed is one of the best decisions you can make for yourself and your baby. A change in hormones during pregnancy causes your breasts to make breast  milk in your milk-producing glands. Hormones prevent breast milk from being released before your baby is born. They also prompt milk flow after birth. Once breastfeeding has begun, thoughts of your baby, as well as his or her sucking or crying, can stimulate the release of milk from your milk-producing glands. Benefits of breastfeeding Research shows that breastfeeding offers many health benefits for infants and mothers. It also offers a cost-free and convenient way to feed your baby. For your baby  Your first milk (colostrum) helps your baby's digestive system to function better.  Special cells in your milk (antibodies) help your baby to fight off infections.  Breastfed babies are less likely to develop asthma, allergies, obesity, or type 2 diabetes. They are also at lower risk for sudden infant death syndrome (SIDS).  Nutrients in breast milk are better able to meet your baby's needs compared to infant formula.  Breast milk improves your baby's brain development. For you  Breastfeeding helps to create a very special bond between you and your baby.  Breastfeeding is convenient. Breast milk costs nothing and is always available at the correct temperature.  Breastfeeding helps to burn calories. It helps you to lose the weight that you gained during pregnancy.  Breastfeeding makes your uterus return faster to its size before pregnancy. It also slows bleeding (lochia) after you give birth.  Breastfeeding helps to lower your risk of developing type 2 diabetes, osteoporosis, rheumatoid arthritis, cardiovascular disease, and breast, ovarian, uterine, and  endometrial cancer later in life. Breastfeeding basics Starting breastfeeding  Find a comfortable place to sit or lie down, with your neck and back well-supported.  Place a pillow or a rolled-up blanket under your baby to bring him or her to the level of your breast (if you are seated). Nursing pillows are specially designed to help support your arms and your baby while you breastfeed.  Make sure that your baby's tummy (abdomen) is facing your abdomen.  Gently massage your breast. With your fingertips, massage from the outer edges of your breast inward toward the nipple. This encourages milk flow. If your milk flows slowly, you may need to continue this action during the feeding.  Support your breast with 4 fingers underneath and your thumb above your nipple (make the letter "C" with your hand). Make sure your fingers are well away from your nipple and your baby's mouth.  Stroke your baby's lips gently with your finger or nipple.  When your baby's mouth is open wide enough, quickly bring your baby to your breast, placing your entire nipple and as much of the areola as possible into your baby's mouth. The areola is the colored area around your nipple. ? More areola should be visible above your baby's upper lip than below the lower lip. ? Your baby's lips should be opened and extended outward (flanged) to ensure an adequate, comfortable latch. ? Your baby's tongue should be between his or her lower gum and your breast.  Make sure that your baby's mouth is correctly positioned around your nipple (latched). Your baby's lips should create a seal on your breast and be turned out (everted).  It is common for your baby to suck about 2-3 minutes in order to start the flow of breast milk. Latching Teaching your baby how to latch onto your breast properly is very important. An improper latch can cause nipple pain, decreased milk supply, and poor weight gain in your baby. Also, if your baby is not  latched onto your nipple properly,  he or she may swallow some air during feeding. This can make your baby fussy. Burping your baby when you switch breasts during the feeding can help to get rid of the air. However, teaching your baby to latch on properly is still the best way to prevent fussiness from swallowing air while breastfeeding. Signs that your baby has successfully latched onto your nipple  Silent tugging or silent sucking, without causing you pain. Infant's lips should be extended outward (flanged).  Swallowing heard between every 3-4 sucks once your milk has started to flow (after your let-down milk reflex occurs).  Muscle movement above and in front of his or her ears while sucking. Signs that your baby has not successfully latched onto your nipple  Sucking sounds or smacking sounds from your baby while breastfeeding.  Nipple pain. If you think your baby has not latched on correctly, slip your finger into the corner of your baby's mouth to break the suction and place it between your baby's gums. Attempt to start breastfeeding again. Signs of successful breastfeeding Signs from your baby  Your baby will gradually decrease the number of sucks or will completely stop sucking.  Your baby will fall asleep.  Your baby's body will relax.  Your baby will retain a small amount of milk in his or her mouth.  Your baby will let go of your breast by himself or herself. Signs from you  Breasts that have increased in firmness, weight, and size 1-3 hours after feeding.  Breasts that are softer immediately after breastfeeding.  Increased milk volume, as well as a change in milk consistency and color by the fifth day of breastfeeding.  Nipples that are not sore, cracked, or bleeding. Signs that your baby is getting enough milk  Wetting at least 1-2 diapers during the first 24 hours after birth.  Wetting at least 5-6 diapers every 24 hours for the first week after birth. The urine  should be clear or pale yellow by the age of 5 days.  Wetting 6-8 diapers every 24 hours as your baby continues to grow and develop.  At least 3 stools in a 24-hour period by the age of 5 days. The stool should be soft and yellow.  At least 3 stools in a 24-hour period by the age of 7 days. The stool should be seedy and yellow.  No loss of weight greater than 10% of birth weight during the first 3 days of life.  Average weight gain of 4-7 oz (113-198 g) per week after the age of 4 days.  Consistent daily weight gain by the age of 5 days, without weight loss after the age of 2 weeks. After a feeding, your baby may spit up a small amount of milk. This is normal. Breastfeeding frequency and duration Frequent feeding will help you make more milk and can prevent sore nipples and extremely full breasts (breast engorgement). Breastfeed when you feel the need to reduce the fullness of your breasts or when your baby shows signs of hunger. This is called "breastfeeding on demand." Signs that your baby is hungry include:  Increased alertness, activity, or restlessness.  Movement of the head from side to side.  Opening of the mouth when the corner of the mouth or cheek is stroked (rooting).  Increased sucking sounds, smacking lips, cooing, sighing, or squeaking.  Hand-to-mouth movements and sucking on fingers or hands.  Fussing or crying. Avoid introducing a pacifier to your baby in the first 4-6 weeks after your baby  is born. After this time, you may choose to use a pacifier. Research has shown that pacifier use during the first year of a baby's life decreases the risk of sudden infant death syndrome (SIDS). Allow your baby to feed on each breast as long as he or she wants. When your baby unlatches or falls asleep while feeding from the first breast, offer the second breast. Because newborns are often sleepy in the first few weeks of life, you may need to awaken your baby to get him or her to  feed. Breastfeeding times will vary from baby to baby. However, the following rules can serve as a guide to help you make sure that your baby is properly fed:  Newborns (babies 78 weeks of age or younger) may breastfeed every 1-3 hours.  Newborns should not go without breastfeeding for longer than 3 hours during the day or 5 hours during the night.  You should breastfeed your baby a minimum of 8 times in a 24-hour period. Breast milk pumping     Pumping and storing breast milk allows you to make sure that your baby is exclusively fed your breast milk, even at times when you are unable to breastfeed. This is especially important if you go back to work while you are still breastfeeding, or if you are not able to be present during feedings. Your lactation consultant can help you find a method of pumping that works best for you and give you guidelines about how long it is safe to store breast milk. Caring for your breasts while you breastfeed Nipples can become dry, cracked, and sore while breastfeeding. The following recommendations can help keep your breasts moisturized and healthy:  Avoid using soap on your nipples.  Wear a supportive bra designed especially for nursing. Avoid wearing underwire-style bras or extremely tight bras (sports bras).  Air-dry your nipples for 3-4 minutes after each feeding.  Use only cotton bra pads to absorb leaked breast milk. Leaking of breast milk between feedings is normal.  Use lanolin on your nipples after breastfeeding. Lanolin helps to maintain your skin's normal moisture barrier. Pure lanolin is not harmful (not toxic) to your baby. You may also hand express a few drops of breast milk and gently massage that milk into your nipples and allow the milk to air-dry. In the first few weeks after giving birth, some women experience breast engorgement. Engorgement can make your breasts feel heavy, warm, and tender to the touch. Engorgement peaks within 3-5 days  after you give birth. The following recommendations can help to ease engorgement:  Completely empty your breasts while breastfeeding or pumping. You may want to start by applying warm, moist heat (in the shower or with warm, water-soaked hand towels) just before feeding or pumping. This increases circulation and helps the milk flow. If your baby does not completely empty your breasts while breastfeeding, pump any extra milk after he or she is finished.  Apply ice packs to your breasts immediately after breastfeeding or pumping, unless this is too uncomfortable for you. To do this: ? Put ice in a plastic bag. ? Place a towel between your skin and the bag. ? Leave the ice on for 20 minutes, 2-3 times a day.  Make sure that your baby is latched on and positioned properly while breastfeeding. If engorgement persists after 48 hours of following these recommendations, contact your health care provider or a Advertising [employer]. Overall health care recommendations while breastfeeding  Eat 3 healthy meals and 3  snacks every day. Well-nourished mothers who are breastfeeding need an additional 450-500 calories a day. You can meet this requirement by increasing the amount of a balanced diet that you eat.  Drink enough water to keep your urine pale yellow or clear.  Rest often, relax, and continue to take your prenatal vitamins to prevent fatigue, stress, and low vitamin and mineral levels in your body (nutrient deficiencies).  Do not use any products that contain nicotine or tobacco, such as cigarettes and e-cigarettes. Your baby may be harmed by chemicals from cigarettes that pass into breast milk and exposure to secondhand smoke. If you need help quitting, ask your health care provider.  Avoid alcohol.  Do not use illegal drugs or marijuana.  Talk with your health care provider before taking any medicines. These include over-the-counter and prescription medicines as well as vitamins and herbal  supplements. Some medicines that may be harmful to your baby can pass through breast milk.  It is possible to become pregnant while breastfeeding. If birth control is desired, ask your health care provider about options that will be safe while breastfeeding your baby. Where to find more information: [employer]: www.llli.org Contact a health care provider if:  You feel like you want to stop breastfeeding or have become frustrated with breastfeeding.  Your nipples are cracked or bleeding.  Your breasts are red, tender, or warm.  You have: ? Painful breasts or nipples. ? A swollen area on either breast. ? A fever or chills. ? Nausea or vomiting. ? Drainage other than breast milk from your nipples.  Your breasts do not become full before feedings by the fifth day after you give birth.  You feel sad and depressed.  Your baby is: ? Too sleepy to eat well. ? Having trouble sleeping. ? More than 65 week old and wetting fewer than 6 diapers in a 24-hour period. ? Not gaining weight by 70 days of age.  Your baby has fewer than 3 stools in a 24-hour period.  Your baby's skin or the white parts of his or her eyes become yellow. Get help right away if:  Your baby is overly tired (lethargic) and does not want to wake up and feed.  Your baby develops an unexplained fever. Summary  Breastfeeding offers many health benefits for infant and mothers.  Try to breastfeed your infant when he or she shows early signs of hunger.  Gently tickle or stroke your baby's lips with your finger or nipple to allow the baby to open his or her mouth. Bring the baby to your breast. Make sure that much of the areola is in your baby's mouth. Offer one side and burp the baby before you offer the other side.  Talk with your health care provider or lactation consultant if you have questions or you face problems as you breastfeed. This information is not intended to replace advice given to you  by your health care provider. Make sure you discuss any questions you have with your health care provider. Document Revised: 08/30/2017 Document Reviewed: 07/07/2016 Elsevier Patient Education  2020 [employer].

## 2019-06-25 NOTE — Addendum Note (Signed)
Addended by: Kathee Delton on: 06/25/2019 10:54 AM   Modules accepted: Orders

## 2019-06-26 LAB — OBSTETRIC PANEL, INCLUDING HIV
Antibody Screen: NEGATIVE
Basophils Absolute: 0 10*3/uL (ref 0.0–0.2)
Basos: 0 %
EOS (ABSOLUTE): 0 10*3/uL (ref 0.0–0.4)
Eos: 0 %
HIV Screen 4th Generation wRfx: NONREACTIVE
Hematocrit: 36.9 % (ref 34.0–46.6)
Hemoglobin: 12.7 g/dL (ref 11.1–15.9)
Hepatitis B Surface Ag: NEGATIVE
Immature Grans (Abs): 0 10*3/uL (ref 0.0–0.1)
Immature Granulocytes: 0 %
Lymphocytes Absolute: 0.7 10*3/uL (ref 0.7–3.1)
Lymphs: 27 %
MCH: 31.9 pg (ref 26.6–33.0)
MCHC: 34.4 g/dL (ref 31.5–35.7)
MCV: 93 fL (ref 79–97)
Monocytes Absolute: 0.2 10*3/uL (ref 0.1–0.9)
Monocytes: 8 %
Neutrophils Absolute: 1.6 10*3/uL (ref 1.4–7.0)
Neutrophils: 65 %
Platelets: 199 10*3/uL (ref 150–450)
RBC: 3.98 x10E6/uL (ref 3.77–5.28)
RDW: 13.1 % (ref 11.7–15.4)
RPR Ser Ql: NONREACTIVE
Rh Factor: POSITIVE
Rubella Antibodies, IGG: 13.6 index (ref >0.99)
WBC: 2.4 10*3/uL — CL (ref 3.4–10.8)

## 2019-06-26 LAB — CERVICOVAGINAL ANCILLARY ONLY
Chlamydia: NEGATIVE
Comment: NEGATIVE
Comment: NORMAL
Neisseria Gonorrhea: NEGATIVE

## 2019-06-26 LAB — HEMOGLOBIN A1C
Est. average glucose Bld gHb Est-mCnc: 85 mg/dL
Hgb A1c MFr Bld: 4.6 % — ABNORMAL LOW (ref 4.8–5.6)

## 2019-06-28 LAB — CULTURE, OB URINE

## 2019-06-28 LAB — URINE CULTURE, OB REFLEX

## 2019-07-01 ENCOUNTER — Telehealth (HOSPITAL_COMMUNITY): Payer: Self-pay | Admitting: *Deleted

## 2019-07-01 NOTE — Telephone Encounter (Signed)
Preadmission screen Interpreter number [phone] Explained necessity of covid test prior to [facility] to patient and her husband via interpreter.  Verbalized understanding and plan of compliance.  Reviewed medications,. NPO status prior to cerclage and arrival time with pt as well.

## 2019-07-08 ENCOUNTER — Other Ambulatory Visit (HOSPITAL_COMMUNITY)
Admission: RE | Admit: 2019-07-08 | Discharge: 2019-07-08 | Disposition: A | Discharge status: Home / Self Care | Payer: BLUE CROSS/BLUE SHIELD | Source: Ambulatory Visit | Attending: Obstetrics and Gynecology | Admitting: Obstetrics and Gynecology

## 2019-07-08 DIAGNOSIS — Z20822 Contact with and (suspected) exposure to covid-19: Secondary | ICD-10-CM | POA: Insufficient documentation

## 2019-07-08 DIAGNOSIS — Z01812 Encounter for preprocedural laboratory examination: Secondary | ICD-10-CM | POA: Insufficient documentation

## 2019-07-08 LAB — SARS CORONAVIRUS 2 (TAT 6-24 HRS): SARS Coronavirus 2: NEGATIVE

## 2019-07-10 ENCOUNTER — Observation Stay (HOSPITAL_COMMUNITY)
Admission: RE | Admit: 2019-07-10 | Discharge: 2019-07-10 | Disposition: A | Discharge status: Home / Self Care | Payer: BLUE CROSS/BLUE SHIELD | Attending: Obstetrics and Gynecology | Admitting: Obstetrics and Gynecology

## 2019-07-10 ENCOUNTER — Encounter (HOSPITAL_COMMUNITY)
Admission: RE | Disposition: A | Discharge status: Home / Self Care | Payer: Self-pay | Source: Home / Self Care | Attending: Obstetrics and Gynecology

## 2019-07-10 ENCOUNTER — Encounter (HOSPITAL_COMMUNITY): Payer: Self-pay | Admitting: Obstetrics and Gynecology

## 2019-07-10 ENCOUNTER — Observation Stay (HOSPITAL_COMMUNITY): Payer: BLUE CROSS/BLUE SHIELD | Admitting: Anesthesiology

## 2019-07-10 ENCOUNTER — Other Ambulatory Visit: Payer: Self-pay

## 2019-07-10 DIAGNOSIS — Z3A12 12 weeks gestation of pregnancy: Secondary | ICD-10-CM | POA: Diagnosis not present

## 2019-07-10 DIAGNOSIS — Z789 Other specified health status: Secondary | ICD-10-CM | POA: Diagnosis present

## 2019-07-10 DIAGNOSIS — O0990 Supervision of high risk pregnancy, unspecified, unspecified trimester: Secondary | ICD-10-CM

## 2019-07-10 DIAGNOSIS — O3431 Maternal care for cervical incompetence, first trimester: Principal | ICD-10-CM | POA: Insufficient documentation

## 2019-07-10 DIAGNOSIS — O09299 Supervision of pregnancy with other poor reproductive or obstetric history, unspecified trimester: Secondary | ICD-10-CM

## 2019-07-10 DIAGNOSIS — O09219 Supervision of pregnancy with history of pre-term labor, unspecified trimester: Secondary | ICD-10-CM

## 2019-07-10 HISTORY — PX: CERVICAL CERCLAGE: SHX1329

## 2019-07-10 LAB — CBC WITH DIFFERENTIAL/PLATELET
Abs Immature Granulocytes: 0.01 10*3/uL (ref 0.00–0.07)
Basophils Absolute: 0 10*3/uL (ref 0.0–0.1)
Basophils Relative: 0 %
Eosinophils Absolute: 0 10*3/uL (ref 0.0–0.5)
Eosinophils Relative: 0 %
HCT: 37 % (ref 36.0–46.0)
Hemoglobin: 12.8 g/dL (ref 12.0–15.0)
Immature Granulocytes: 0 %
Lymphocytes Relative: 25 %
Lymphs Abs: 0.7 10*3/uL (ref 0.7–4.0)
MCH: 31.7 pg (ref 26.0–34.0)
MCHC: 34.6 g/dL (ref 30.0–36.0)
MCV: 91.6 fL (ref 80.0–100.0)
Monocytes Absolute: 0.3 10*3/uL (ref 0.1–1.0)
Monocytes Relative: 9 %
Neutro Abs: 1.9 10*3/uL (ref 1.7–7.7)
Neutrophils Relative %: 66 %
Platelets: 191 10*3/uL (ref 150–400)
RBC: 4.04 MIL/uL (ref 3.87–5.11)
RDW: 13.3 % (ref 11.5–15.5)
WBC: 2.8 10*3/uL — ABNORMAL LOW (ref 4.0–10.5)
nRBC: 0 % (ref 0.0–0.2)

## 2019-07-10 SURGERY — CERCLAGE, CERVIX, VAGINAL APPROACH
Anesthesia: Spinal | Site: Vagina | Wound class: Clean Contaminated

## 2019-07-10 MED ORDER — HYDROMORPHONE HCL 1 MG/ML IJ SOLN: 0.2500 mg | INTRAMUSCULAR | Status: DC | PRN

## 2019-07-10 MED ORDER — ONDANSETRON HCL 4 MG/2ML IJ SOLN
INTRAMUSCULAR | Status: DC | PRN
  Administered 2019-07-10: 4 mg via INTRAVENOUS

## 2019-07-10 MED ORDER — OXYCODONE HCL 5 MG PO TABS
ORAL_TABLET | ORAL | Status: AC
  Filled 2019-07-10: qty 1

## 2019-07-10 MED ORDER — DOCUSATE SODIUM 100 MG PO CAPS: 100.0000 mg | ORAL_CAPSULE | Freq: Two times a day (BID) | ORAL | 2 refills | Status: DC | PRN

## 2019-07-10 MED ORDER — OXYCODONE-ACETAMINOPHEN 5-325 MG PO TABS: 1.0000 | ORAL_TABLET | ORAL | 0 refills | Status: DC | PRN

## 2019-07-10 MED ORDER — BUPIVACAINE IN DEXTROSE 0.75-8.25 % IT SOLN
INTRATHECAL | Status: DC | PRN
  Administered 2019-07-10: 1 mL via INTRATHECAL

## 2019-07-10 MED ORDER — STERILE WATER FOR IRRIGATION IR SOLN
Status: DC | PRN
  Administered 2019-07-10: 1000 mL

## 2019-07-10 MED ORDER — PROMETHAZINE HCL 25 MG/ML IJ SOLN: 6.2500 mg | INTRAMUSCULAR | Status: DC | PRN

## 2019-07-10 MED ORDER — OXYCODONE HCL 5 MG PO TABS
5.0000 mg | ORAL_TABLET | Freq: Once | ORAL | Status: AC | PRN
  Administered 2019-07-10: 12:00:00 5 mg via ORAL

## 2019-07-10 MED ORDER — KETOROLAC TROMETHAMINE 30 MG/ML IJ SOLN: 30.0000 mg | Freq: Once | INTRAMUSCULAR | Status: DC | PRN

## 2019-07-10 MED ORDER — ONDANSETRON HCL 4 MG/2ML IJ SOLN
INTRAMUSCULAR | Status: AC
  Filled 2019-07-10: qty 2

## 2019-07-10 MED ORDER — INDOMETHACIN 50 MG RE SUPP
100.0000 mg | Freq: Once | RECTAL | Status: DC
  Filled 2019-07-10 (×2): qty 2

## 2019-07-10 MED ORDER — LACTATED RINGERS IV SOLN: INTRAVENOUS | Status: DC

## 2019-07-10 MED ORDER — OXYCODONE HCL 5 MG/5ML PO SOLN: 5.0000 mg | Freq: Once | ORAL | Status: AC | PRN

## 2019-07-10 SURGICAL SUPPLY — 17 items
CANISTER SUCT 3000ML PPV (MISCELLANEOUS) ×3 IMPLANT
GLOVE BIO SURGEON STRL SZ7.5 (GLOVE) ×6 IMPLANT
GOWN STRL REUS W/TWL LRG LVL3 (GOWN DISPOSABLE) ×3 IMPLANT
GOWN STRL REUS W/TWL XL LVL3 (GOWN DISPOSABLE) ×3 IMPLANT
NEEDLE MAYO CATGUT SZ4 (NEEDLE) ×3 IMPLANT
PACK VAGINAL MINOR WOMEN LF (CUSTOM PROCEDURE TRAY) ×3 IMPLANT
PAD OB MATERNITY 4.3X12.25 (PERSONAL CARE ITEMS) ×3 IMPLANT
PAD PREP 24X48 CUFFED NSTRL (MISCELLANEOUS) ×3 IMPLANT
SUT ETHIBOND 0 (SUTURE) ×3 IMPLANT
SUT MERSILENE FIBER S 5 CTX 12 (SUTURE) ×3 IMPLANT
SUT SILK 2 0 SH (SUTURE) ×3 IMPLANT
SYR BULB IRRIGATION 50ML (SYRINGE) ×3 IMPLANT
TOWEL OR 17X24 6PK STRL BLUE (TOWEL DISPOSABLE) ×6 IMPLANT
TRAY FOLEY W/BAG SLVR 14FR (SET/KITS/TRAYS/PACK) ×3 IMPLANT
TUBING NON-CON 1/4 X 20 CONN (TUBING) ×2 IMPLANT
TUBING NON-CON 1/4 X 20' CONN (TUBING) ×1
YANKAUER SUCT BULB TIP NO VENT (SUCTIONS) ×3 IMPLANT

## 2019-07-10 NOTE — Anesthesia Procedure Notes (Signed)
Spinal  Patient location during procedure: OB Start time: 07/10/2019 9:28 AM End time: 07/10/2019 9:33 AM Staffing Performed: anesthesiologist  Anesthesiologist: Lowella Curb, MD Preanesthetic Checklist Completed: patient identified, IV checked, risks and benefits discussed, surgical consent, monitors and equipment checked, pre-op evaluation and timeout performed Spinal Block Patient position: sitting Prep: DuraPrep and site prepped and draped Patient monitoring: heart rate, cardiac monitor, continuous pulse ox and blood pressure Approach: midline Location: L3-4 Injection technique: single-shot Needle Needle type: Pencan  Needle gauge: 24 G Needle length: 10 cm Assessment Sensory level: T4

## 2019-07-10 NOTE — H&P (Addendum)
Preoperative History and Physical  Kathy Wilkerson is a 34 y.o. G3P0110 at [redacted]w[redacted]d with history of cervical incompetence, here for cerclage placement.    No significant preoperative concerns. [employer] interpreter present for this encounter; FOB also present.  Proposed surgery: Transvaginal cervical cerclage  Past Medical History:  Diagnosis Date  . Medical history non-contributory   . Preterm labor    Past Surgical History:  Procedure Laterality Date  . NO PAST SURGERIES     OB History  Gravida Para Term Preterm AB Living  3 1   1 1  0  SAB TAB Ectopic Multiple Live Births  1       1    # Outcome Date GA Lbr Len/2nd Weight Sex Delivery Anes PTL Lv  3 Current           2 SAB 10/06/18 [redacted]w[redacted]d   F  None  FD     Birth Comments: Placenta delivered around 1030, large retroplacental clots, EBL 893 ml     Complications: Other Excessive Bleeding  1 Preterm 11/05/17 [redacted]w[redacted]d  2000 g F Vag-Spont  N ND     Birth Comments: bleeding  Patient denies any other pertinent gynecologic issues.   No current facility-administered medications on file prior to encounter.   Current Outpatient Medications on File Prior to Encounter  Medication Sig Dispense Refill  . Prenatal Vit-Fe Fumarate-FA (PRENATAL VITAMINS PO) Take 1 tablet by mouth daily.     No Known Allergies  Social History:   reports that she has never smoked. She has never used smokeless tobacco. She reports that she does not drink alcohol or use drugs.  Family History  Problem Relation Age of Onset  . Healthy Mother   . Diabetes Father   . Heart disease Father   . Hypertension Father     Review of Systems: Pertinent items noted in HPI and remainder of comprehensive ROS otherwise negative.  PHYSICAL EXAM: Blood pressure 118/72, pulse 88, temperature 98 F (36.7 C), resp. rate 18, height 5\' 2"  (1.575 m), weight 54 kg, last menstrual period 04/13/2019, SpO2 98 %.  Bedside scan: FHR 150s, adequate amniotic fluid  seen  CONSTITUTIONAL: Well-developed, well-nourished female in no acute distress.  HENT:  Normocephalic, atraumatic, External right and left ear normal. Oropharynx is clear and moist EYES: Conjunctivae and EOM are normal. Pupils are equal, round, and reactive to light. No scleral icterus.  NECK: Normal range of motion, supple, no masses SKIN: Skin is warm and dry. No rash noted. Not diaphoretic. No erythema. No pallor. NEUROLOGIC: Alert and oriented to person, place, and time. Normal reflexes, muscle tone coordination. No cranial nerve deficit noted. PSYCHIATRIC: Normal mood and affect. Normal behavior. Normal judgment and thought content. CARDIOVASCULAR: Normal heart rate noted, regular rhythm RESPIRATORY: Effort and breath sounds normal, no problems with respiration noted ABDOMEN: Soft, nontender, nondistended. PELVIC: Deferred MUSCULOSKELETAL: Normal range of motion. No edema and no tenderness. 2+ distal pulses.  Labs: Results for orders placed or performed during the hospital encounter of 07/10/19 (from the past 336 hour(s))  CBC with Differential/Platelet   Collection Time: 07/10/19  8:19 AM  Result Value Ref Range   WBC 2.8 (L) 4.0 - 10.5 K/uL   RBC 4.04 3.87 - 5.11 MIL/uL   Hemoglobin 12.8 12.0 - 15.0 g/dL   HCT 37.0 36.0 - 46.0 %   MCV 91.6 80.0 - 100.0 fL   MCH 31.7 26.0 - 34.0 pg   MCHC 34.6 30.0 - 36.0 g/dL   RDW  13.3 11.5 - 15.5 %   Platelets 191 150 - 400 K/uL   nRBC 0.0 0.0 - 0.2 %   Neutrophils Relative % 66 %   Neutro Abs 1.9 1.7 - 7.7 K/uL   Lymphocytes Relative 25 %   Lymphs Abs 0.7 0.7 - 4.0 K/uL   Monocytes Relative 9 %   Monocytes Absolute 0.3 0.1 - 1.0 K/uL   Eosinophils Relative 0 %   Eosinophils Absolute 0.0 0.0 - 0.5 K/uL   Basophils Relative 0 %   Basophils Absolute 0.0 0.0 - 0.1 K/uL   Immature Granulocytes 0 %   Abs Immature Granulocytes 0.01 0.00 - 0.07 K/uL  Results for orders placed or performed during the hospital encounter of 07/08/19 (from  the past 336 hour(s))  SARS CORONAVIRUS 2 (TAT 6-24 HRS) Nasopharyngeal Nasopharyngeal Swab   Collection Time: 07/08/19  9:11 AM   Specimen: Nasopharyngeal Swab  Result Value Ref Range   SARS Coronavirus 2 NEGATIVE NEGATIVE    Imaging Studies: No results found.  Assessment: Patient Active Problem List   Diagnosis Date Noted  . Supervision of high risk pregnancy, antepartum 06/02/2019  . History of incompetent cervix, currently pregnant 10/06/2018  . Previous preterm delivery, antepartum 08/21/2018  . Language barrier 08/21/2018    Plan: Patient will undergo surgical management with prophylactic cerclage placement.   The risks of surgery were discussed in detail with the patient including but not limited to: bleeding; infection which may require antibiotic therapy; injury to cervix, vagina other surrounding organs; risk of ruptured membranes and/or preterm delivery and other postoperative or anesthesia complications.  Routine postoperative instructions will be reviewed with the patient and her family in detail after surgery.  The patient concurred with the proposed plan, giving informed written consent for the surgery.  Patient has been NPO since last night and she will remain NPO for procedure.  Anesthesia and OR aware.  Preoperative SCDs ordered on call to the OR.  To OR when ready.    Jaynie Collins, MD, FACOG Obstetrician & Gynecologist, [facility] for [employer], [facility]

## 2019-07-10 NOTE — Anesthesia Preprocedure Evaluation (Signed)
Anesthesia Evaluation  Patient identified by MRN, date of birth, ID band Patient awake    Reviewed: Allergy & Precautions, NPO status , Patient's Chart, lab work & pertinent test results  Airway Mallampati: II  TM Distance: >3 FB Neck ROM: Full    Dental no notable dental hx.    Pulmonary neg pulmonary ROS,    Pulmonary exam normal breath sounds clear to auscultation       Cardiovascular negative cardio ROS Normal cardiovascular exam Rhythm:Regular Rate:Normal     Neuro/Psych negative neurological ROS  negative psych ROS   GI/Hepatic negative GI ROS, Neg liver ROS,   Endo/Other  negative endocrine ROS  Renal/GU negative Renal ROS  negative genitourinary   Musculoskeletal negative musculoskeletal ROS (+)   Abdominal   Peds negative pediatric ROS (+)  Hematology negative hematology ROS (+)   Anesthesia Other Findings   Reproductive/Obstetrics (+) Pregnancy                             Anesthesia Physical Anesthesia Plan  ASA: II  Anesthesia Plan: Spinal   Post-op Pain Management:    Induction:   PONV Risk Score and Plan: 2 and Ondansetron, Midazolam and Treatment may vary due to age or medical condition  Airway Management Planned: Natural Airway  Additional Equipment:   Intra-op Plan:   Post-operative Plan:   Informed Consent: I have reviewed the patients History and Physical, chart, labs and discussed the procedure including the risks, benefits and alternatives for the proposed anesthesia with the patient or authorized representative who has indicated his/her understanding and acceptance.     Dental advisory given  Plan Discussed with: CRNA  Anesthesia Plan Comments:         Anesthesia Quick Evaluation  

## 2019-07-10 NOTE — Discharge Instructions (Signed)
Cerclage of the Cervix Cerclage of the cervix is a surgical procedure for an incompetent cervix. An incompetent cervix is a weak cervix that opens up before labor begins. Cerclage of the cervix sews the cervix closed during pregnancy.  LET YOUR CAREGIVER KNOW ABOUT:   Allergies to foods or medications.  All over-the-counter, prescription, herbal, eye drops and cream medications you are using.  Taking illegal drugs or drinking an excessive amount of alcohol.  Any recent colds or infections.  Past problems with anesthetics or novocaine.  Past surgery.  History of blood clots or abnormal bleeding problems.  Other medical or health problems. RISKS AND COMPLICATIONS   Infection.  Bleeding.  Rupturing the amniotic sac (membranes).  Going into early labor and delivery.  Problems with the anesthesia.  Infection of the amniotic sac. BEFORE THE PROCEDURE   Do not take aspirin.  Do not eat or drink anything 8 hours before the procedure.  Do not smoke.  If you are being admitted the same day as the procedure, arrive at the hospital at least 60 minutes before the surgery or as directed. During this time, you will sign the necessary forms and get prepared for the surgery.  A waiting area is available for family and friends. PROCEDURE   You will be given an IV (intravenous) and medication to relax you.  You will be given a spinal for anesthesia.  A stitch will be placed in and around the cervix to tighten it and keep it closed. AFTER THE PROCEDURE   You will go to a recovery room where you and the baby are monitored.  Once you are awake, stable, and taking fluids well, barring other problems, you will be allowed to go home  You may get progesterone to prevent uterine contractions and stabilize cervical length.  Have someone drive you home and stay with you for a day or two.  You may be given medications to take when you go home. HOME CARE INSTRUCTIONS   Only take  over-the-counter or prescriptions medicines for pain, discomfort or fever as directed by your caregiver.  Avoid physical activities and exercise until your caregiver says it is okay.  Resume your usual diet.  Do not douche.  Do not have sexual intercourse for one week after procedure  Keep your follow up surgical and prenatal appointments with your caregiver. SEEK MEDICAL CARE IF:   You have abnormal vaginal discharge.  You develop a rash.  You are having problems with your medications.  You become lightheaded or feel faint. SEEK IMMEDIATE MEDICAL CARE IF:   You develop vaginal bleeding.  You are leaking fluid or have a gush of fluid from the vagina.  You develop a temperature of 102 F (38.9 C) or higher.  You pass out.  You have uterine contractions.  You feel the baby is not moving as much as usual or cannot feel the baby move. Document Released: 05/18/2008 Document Revised: 08/28/2011 Document Reviewed: 05/18/2008 [facility] Patient Information 2013 [place].  Cervical Cerclage, Care After This sheet gives you information about how to care for yourself after your procedure. Your health care provider may also give you more specific instructions. If you have problems or questions, contact your health care provider. What can I expect after the procedure? After your procedure, it is common to have:  Cramping in your abdomen.  Mucus discharge from your vagina. This may last for several days.  Painful urination (dysuria).  Spotting, or small drops of blood coming from your  vagina. Follow these instructions at home:  Medicines  Take over-the-counter and prescription medicines only as told by your health care provider.  Ask your health care provider if the medicine prescribed to you requires you to avoid driving or using heavy machinery. General instructions  If you are told to go on bed rest, follow instructions from your health care provider. You may need  to be on bed rest for up to 3 days.  Keep track of your vaginal discharge and watch for any changes. If you notice changes, tell your health care provider.  Avoid physical activities and exercise until your health care provider approves. Ask your health care provider what activities are safe for you.  Do not douche or have sex until your health care provider says it is okay to do so.  Keep all follow-up visits, including prenatal visits, as told by your health care provider. This is important. ? Prenatal visits are all the care that you receive before the birth of your baby. ? You may also need an ultrasound.  You may be asked to have weekly visits to have your cervix checked. Contact a health care provider if you:  Have abnormal discharge from your vagina, such as clots.  Have a bad-smelling discharge from your vagina.  Develop a rash on your skin. This may look like redness and swelling.  Become light-headed or feel like you are going to faint.  Have abdominal pain that does not get better with medicine.  Have nausea or vomiting that does not go away. Get help right away if you:  Have vaginal bleeding that is heavier or more frequent than spotting.  Are leaking fluid or your water breaks.  Have a fever or chills.  Faint.  Have uterine contractions. These may feel like: ? A back ache. ? Lower abdominal pain. ? Mild cramps, similar to menstrual cramps. ? Tightening or pressure in your abdomen.  Think that your baby is not moving as much as usual, or you cannot feel your baby move.  Have chest pain.  Have shortness of breath. Summary  After the procedure, it is common to have cramping, vaginal discharge, painful urination, and small drops of blood coming from your vagina.  If you are told to go on bed rest, follow instructions from your health care provider. You may need to be on bed rest for up to 3 days.  Keep track of your vaginal discharge and watch for any  changes. If you notice changes, tell your health care provider.  Contact a health care provider if you have abnormal vaginal discharge, become light-headed, or have pain that cannot be controlled with medicines.  Get help right away if you have heavy vaginal bleeding, your water breaks, or you have uterine contractions. Also, get help right away if your baby is not moving as much as usual, or you have chest pain or shortness of breath. This information is not intended to replace advice given to you by your health care provider. Make sure you discuss any questions you have with your health care provider. Document Revised: 04/01/2019 Document Reviewed: 01/29/2019 Elsevier Patient Education  2020 [employer].    Preterm Labor and Birth Information Pregnancy normally lasts 39-41 weeks. Preterm labor is when labor starts early. It starts before you have been pregnant for 37 whole weeks. What are the risk factors for preterm labor? Preterm labor is more likely to occur in women who:  Have an infection while pregnant.  Have a cervix  that is short.  Have gone into preterm labor before.  Have had surgery on their cervix.  Are younger than age 89.  Are older than age 17.  Are African American.  Are pregnant with two or more babies.  Take street drugs while pregnant.  Smoke while pregnant.  Do not gain enough weight while pregnant.  Got pregnant right after another pregnancy. What are the symptoms of preterm labor? Symptoms of preterm labor include:  Cramps. The cramps may feel like the cramps some women get during their period. The cramps may happen with watery poop (diarrhea).  Pain in the belly (abdomen).  Pain in the lower back.  Regular contractions or tightening. It may feel like your belly is getting tighter.  Pressure in the lower belly that seems to get stronger.  More fluid (discharge) leaking from the vagina. The fluid may be watery or bloody.  Water  breaking. Why is it important to notice signs of preterm labor? Babies who are born early may not be fully developed. They have a higher chance for:  Long-term heart problems.  Long-term lung problems.  Trouble controlling body systems, like breathing.  Bleeding in the brain.  A condition called cerebral palsy.  Learning difficulties.  Death. These risks are highest for babies who are born before 34 weeks of pregnancy. How is preterm labor treated? Treatment depends on:  How long you were pregnant.  Your condition.  The health of your baby. Treatment may involve:  Having a stitch (suture) placed in your cervix. When you give birth, your cervix opens so the baby can come out. The stitch keeps the cervix from opening too soon.  Staying at the hospital.  Taking or getting medicines, such as: ? Hormone medicines. ? Medicines to stop contractions. ? Medicines to help the baby's lungs develop. ? Medicines to prevent your baby from having cerebral palsy. What should I do if I am in preterm labor? If you think you are going into labor too soon, call your doctor right away. How can I prevent preterm labor?  Do not use any tobacco products. ? Examples of these are cigarettes, chewing tobacco, and e-cigarettes. ? If you need help quitting, ask your doctor.  Do not use street drugs.  Do not use any medicines unless you ask your doctor if they are safe for you.  Talk with your doctor before taking any herbal supplements.  Make sure you gain enough weight.  Watch for infection. If you think you might have an infection, get it checked right away.  If you have gone into preterm labor before, tell your doctor. This information is not intended to replace advice given to you by your health care provider. Make sure you discuss any questions you have with your health care provider. Document Revised: 09/27/2018 Document Reviewed: 10/27/2015 Elsevier Patient Education  2020  [employer] Cervical Cerclage, Care After This sheet gives you information about how to care for yourself after your procedure. Your health care provider may also give you more specific instructions. If you have problems or questions, contact your health care provider. What can I expect after the procedure? After your procedure, it is common to have:  Cramping in your abdomen.  Mucus discharge from your vagina. This may last for several days.  Painful urination (dysuria).  Spotting, or small drops of blood coming from your vagina. Follow these instructions at home:  Medicines  Take over-the-counter and prescription medicines only as told by your health care provider.  Ask your health care provider if the medicine prescribed to you requires you to avoid driving or using heavy machinery. General instructions  If you are told to go on bed rest, follow instructions from your health care provider. You may need to be on bed rest for up to 3 days.  Keep track of your vaginal discharge and watch for any changes. If you notice changes, tell your health care provider.  Avoid physical activities and exercise until your health care provider approves. Ask your health care provider what activities are safe for you.  Do not douche or have sex until your health care provider says it is okay to do so.  Keep all follow-up visits, including prenatal visits, as told by your health care provider. This is important. ? Prenatal visits are all the care that you receive before the birth of your baby. ? You may also need an ultrasound.  You may be asked to have weekly visits to have your cervix checked. Contact a health care provider if you:  Have abnormal discharge from your vagina, such as clots.  Have a bad-smelling discharge from your vagina.  Develop a rash on your skin. This may look like redness and swelling.  Become light-headed or feel like you are going to faint.  Have abdominal pain  that does not get better with medicine.  Have nausea or vomiting that does not go away. Get help right away if you:  Have vaginal bleeding that is heavier or more frequent than spotting.  Are leaking fluid or your water breaks.  Have a fever or chills.  Faint.  Have uterine contractions. These may feel like: ? A back ache. ? Lower abdominal pain. ? Mild cramps, similar to menstrual cramps. ? Tightening or pressure in your abdomen.  Think that your baby is not moving as much as usual, or you cannot feel your baby move.  Have chest pain.  Have shortness of breath. Summary  After the procedure, it is common to have cramping, vaginal discharge, painful urination, and small drops of blood coming from your vagina.  If you are told to go on bed rest, follow instructions from your health care provider. You may need to be on bed rest for up to 3 days.  Keep track of your vaginal discharge and watch for any changes. If you notice changes, tell your health care provider.  Contact a health care provider if you have abnormal vaginal discharge, become light-headed, or have pain that cannot be controlled with medicines.  Get help right away if you have heavy vaginal bleeding, your water breaks, or you have uterine contractions. Also, get help right away if your baby is not moving as much as usual, or you have chest pain or shortness of breath. This information is not intended to replace advice given to you by your health care provider. Make sure you discuss any questions you have with your health care provider. Document Revised: 04/01/2019 Document Reviewed: 01/29/2019 Elsevier Patient Education  2020 [employer].

## 2019-07-10 NOTE — Op Note (Signed)
Kathy Wilkerson   PROCEDURE DATE: 07/10/2019  PREOPERATIVE DIAGNOSIS: Intrauterine pregnancy at [redacted]w[redacted]d, history of cervical incompetence   POSTOPERATIVE DIAGNOSIS: The same PROCEDURE: Transvaginal McDonald Cervical Cerclage Placement SURGEON:  Dr. Jaynie Collins  INDICATIONS: 34 y.o. G3P0110 at [redacted]w[redacted]d with history of cervical incompetence, here for cerclage placement.   The risks of surgery were discussed in detail with the patient including but not limited to: bleeding; infection which may require antibiotic therapy; injury to cervix, vagina other surrounding organs; risk of ruptured membranes and/or preterm delivery and other postoperative or anesthesia complications.  Written informed consent was obtained.    FINDINGS:  About 3 cm palpable cervical length in the vagina, closed cervix, suture knot placed anteriorly.  ANESTHESIA:  Spinal ESTIMATED BLOOD LOSS: 25 ml COMPLICATIONS: None immediate  PROCEDURE IN DETAIL:  The patient received intravenous antibiotics and had sequential compression devices applied to her lower extremities while in the preoperative area.  Reassuring fetal heart rate was also obtained using a doppler. She was then taken to the operating room where spinal anesthesia was administered and was found to be adequate.  She was placed in the dorsal lithotomy, and was prepped and draped in a sterile manner. Her bladder was catheterized for an unmeasured amount of clear, yellow urine.  After an adequate timeout was performed, a vaginal speculum was then placed in the patient's vagina.  The anterior and posterior lips of the cervix were grasped with ring forceps. A curved needle with a 0 [place]il[place] stitch with an interwoven internal 1-0 Ethibond suture was inserted at 12 o'clock, as high as possible at the junction of the rugated vaginal epithelium and the smooth cervix, at least 3 cm above the external os.  Four bites are taken circumferentially around the entire cervix in a purse-string  fashion, each bite should be deep enough to extend at least midway into the cervical stroma, but not into the endocervical canal. The two ends of the suture were then tied securely anteriorly and cut, leaving the ends long enough to grasp with a clamp when it is time to remove it. There was minimal bleeding noted and the ring forceps were removed with good hemostasis noted.  No immediate complications noted.  All instruments were removed from the patient's vagina.  Indomethacin 100 mg rectal suppository was placed.  Instrument, needle and sponge counts were correct x 2. The patient tolerated the procedure well, and was taken to the recovery area awake and in stable condition.  Reassuring fetal heart rate was also obtained using an ultrasound in the recovery area.  The patient will be discharged to home as per PACU criteria.  Routine postoperative instructions given.  She was prescribed Percocet and Colace.  She will follow up in the clinic on 07/21/19 for postoperative evaluation and ongoing prenatal care.  Jaynie Collins, MD, FACOG Obstetrician & Gynecologist, [facility] for [employer], [facility]

## 2019-07-10 NOTE — Transfer of Care (Signed)
Immediate Anesthesia Transfer of Care Note  Patient: Kathy Wilkerson  Procedure(s) Performed: CERCLAGE CERVICAL (N/A Vagina )  Patient Location: PACU  Anesthesia Type:Spinal  Level of Consciousness: awake, alert  and oriented  Airway & Oxygen Therapy: Patient Spontanous Breathing  Post-op Assessment: Report given to RN and Post -op Vital signs reviewed and stable  Post vital signs: Reviewed and stable  Last Vitals:  Vitals Value Taken Time  BP 113/74 07/10/19 1020  Temp    Pulse 79 07/10/19 1024  Resp 16 07/10/19 1024  SpO2 100 % 07/10/19 1024  Vitals shown include unvalidated device data.  Last Pain: There were no vitals filed for this visit.       Complications: No apparent anesthesia complications

## 2019-07-10 NOTE — Anesthesia Postprocedure Evaluation (Signed)
Anesthesia Post Note  Patient: Kathy Wilkerson  Procedure(s) Performed: CERCLAGE CERVICAL (N/A Vagina )     Patient location during evaluation: PACU Anesthesia Type: Spinal Level of consciousness: awake and alert Pain management: pain level controlled Vital Signs Assessment: post-procedure vital signs reviewed and stable Respiratory status: spontaneous breathing, nonlabored ventilation and respiratory function stable Cardiovascular status: blood pressure returned to baseline and stable Postop Assessment: no apparent nausea or vomiting Anesthetic complications: no    Last Vitals:  Vitals:   07/10/19 1115 07/10/19 1123  BP: 117/78   Pulse: 69   Resp: 15 18  Temp:    SpO2: 100% 100%    Last Pain: There were no vitals filed for this visit. Pain Goal:    LLE Motor Response: Purposeful movement (07/10/19 1100)   RLE Motor Response: Purposeful movement (07/10/19 1100)       Epidural/Spinal Function Cutaneous sensation: Able to Discern Pressure (07/10/19 1100), Patient able to flex knees: Yes (07/10/19 1100), Patient able to lift hips off bed: Yes (07/10/19 1100), Back pain beyond tenderness at insertion site: No (07/10/19 1100), Progressively worsening motor and/or sensory loss: No (07/10/19 1100), Bowel and/or bladder incontinence post epidural: No (07/10/19 1100)  Lowella Curb

## 2019-07-11 LAB — CYTOLOGY - PAP: Diagnosis: NEGATIVE

## 2019-07-21 ENCOUNTER — Encounter: Payer: Self-pay | Admitting: Family Medicine

## 2019-07-21 ENCOUNTER — Other Ambulatory Visit: Payer: Self-pay

## 2019-07-21 ENCOUNTER — Ambulatory Visit (INDEPENDENT_AMBULATORY_CARE_PROVIDER_SITE_OTHER): Payer: BLUE CROSS/BLUE SHIELD | Admitting: Family Medicine

## 2019-07-21 VITALS — BP 115/76 | HR 92 | Wt 119.0 lb

## 2019-07-21 DIAGNOSIS — Z23 Encounter for immunization: Secondary | ICD-10-CM | POA: Diagnosis not present

## 2019-07-21 DIAGNOSIS — O09219 Supervision of pregnancy with history of pre-term labor, unspecified trimester: Secondary | ICD-10-CM

## 2019-07-21 DIAGNOSIS — O09299 Supervision of pregnancy with other poor reproductive or obstetric history, unspecified trimester: Secondary | ICD-10-CM

## 2019-07-21 DIAGNOSIS — O0992 Supervision of high risk pregnancy, unspecified, second trimester: Secondary | ICD-10-CM

## 2019-07-21 DIAGNOSIS — O09212 Supervision of pregnancy with history of pre-term labor, second trimester: Secondary | ICD-10-CM

## 2019-07-21 DIAGNOSIS — Z789 Other specified health status: Secondary | ICD-10-CM

## 2019-07-21 DIAGNOSIS — Z3A14 14 weeks gestation of pregnancy: Secondary | ICD-10-CM

## 2019-07-21 DIAGNOSIS — O09292 Supervision of pregnancy with other poor reproductive or obstetric history, second trimester: Secondary | ICD-10-CM

## 2019-07-21 DIAGNOSIS — O0990 Supervision of high risk pregnancy, unspecified, unspecified trimester: Secondary | ICD-10-CM

## 2019-07-21 MED ORDER — BLOOD PRESSURE KIT DEVI: 1.0000 | Freq: Once | 0 refills | Status: AC

## 2019-07-21 MED ORDER — PRENATAL PLUS 27-1 MG PO TABS: 1.0000 | ORAL_TABLET | Freq: Every day | ORAL | 11 refills | Status: DC

## 2019-07-21 NOTE — Patient Instructions (Signed)

## 2019-07-21 NOTE — Progress Notes (Signed)
   PRENATAL VISIT NOTE  Subjective:  Kathy Wilkerson is a 34 y.o. G3P0110 at 22w1dbeing seen today for ongoing prenatal care.  She is currently monitored for the following issues for this high-risk pregnancy and has Previous preterm delivery, antepartum; Language barrier; History of incompetent cervix, currently pregnant; and Supervision of high risk pregnancy, antepartum on their problem list.  Patient reports no complaints.  Contractions: Not present. Vag. Bleeding: None.  Movement: Absent. Denies leaking of fluid.   The following portions of the patient's history were reviewed and updated as appropriate: allergies, current medications, past family history, past medical history, past social history, past surgical history and problem list.   Objective:   Vitals:   07/21/19 1028  BP: 115/76  Pulse: 92  Weight: 119 lb (54 kg)    Fetal Status: Fetal Heart Rate (bpm): 150   Movement: Absent     General:  Alert, oriented and cooperative. Patient is in no acute distress.  Skin: Skin is warm and dry. No rash noted.   Cardiovascular: Normal heart rate noted  Respiratory: Normal respiratory effort, no problems with respiration noted  Abdomen: Soft, gravid, appropriate for gestational age.  Pain/Pressure: Present     Pelvic: Cervical exam deferred        Extremities: Normal range of motion.  Edema: None  Mental Status: Normal mood and affect. Normal behavior. Normal judgment and thought content.   Assessment and Plan:  Pregnancy: G3P0110 at 132w1d. Previous preterm delivery, antepartum Offered 17 P. Shared decision making and she will discuss with husband and let us[place]oreanow at time of video visit next week, if she wants to do it, will need to order and teach her how to use the auto-injector.  2. History of incompetent cervix, currently pregnant S/p Cerclage doing well  3. Language barrier Arabic interpreter: Live used   4. Supervision of high risk pregnancy, antepartum - prenatal  vitamin w/FE, FA (PRENATAL 1 + 1) 27-1 MG TABS tablet; Take 1 tablet by mouth daily at 12 noon.  Dispense: 30 tablet; Refill: 11 - Blood Pressure Monitoring (BLOOD PRESSURE KIT) DEVI; 1 Device by Does not apply route once for 1 dose.  Dispense: 1 each; Refill: 0 - Genetic Screening  5. Need for influenza vaccination - Flu Vaccine QUAD 36+ mos IM  General obstetric precautions including but not limited to vaginal bleeding, contractions, leaking of fluid and fetal movement were reviewed in detail with the patient. Please refer to After Visit Summary for other counseling recommendations.   Return in 1 week (on 07/28/2019) for virtual to discuss 17 P.  Future Appointments  Date Time Provider [place]3/01/2020 10:45 AM WH-MFC [place] WH-MFCUS MFC-US  08/25/2019 10:50 AM WH-MFC NURSE WH-MFC MFC-US    TaDonnamae JudeMD

## 2019-07-24 ENCOUNTER — Encounter: Payer: Self-pay | Admitting: *Deleted

## 2019-07-29 ENCOUNTER — Encounter: Payer: Self-pay | Admitting: General Practice

## 2019-07-31 ENCOUNTER — Telehealth: Payer: Self-pay | Admitting: General Practice

## 2019-07-31 ENCOUNTER — Other Ambulatory Visit: Payer: Self-pay

## 2019-07-31 ENCOUNTER — Ambulatory Visit (INDEPENDENT_AMBULATORY_CARE_PROVIDER_SITE_OTHER): Payer: Medicaid Other | Admitting: Obstetrics & Gynecology

## 2019-07-31 VITALS — BP 110/70 | HR 77 | Wt 119.0 lb

## 2019-07-31 DIAGNOSIS — Z3A15 15 weeks gestation of pregnancy: Secondary | ICD-10-CM

## 2019-07-31 DIAGNOSIS — O3432 Maternal care for cervical incompetence, second trimester: Secondary | ICD-10-CM

## 2019-07-31 DIAGNOSIS — O09219 Supervision of pregnancy with history of pre-term labor, unspecified trimester: Secondary | ICD-10-CM

## 2019-07-31 DIAGNOSIS — O09292 Supervision of pregnancy with other poor reproductive or obstetric history, second trimester: Secondary | ICD-10-CM

## 2019-07-31 DIAGNOSIS — O3430 Maternal care for cervical incompetence, unspecified trimester: Secondary | ICD-10-CM | POA: Insufficient documentation

## 2019-07-31 DIAGNOSIS — D72818 Other decreased white blood cell count: Secondary | ICD-10-CM

## 2019-07-31 DIAGNOSIS — O0990 Supervision of high risk pregnancy, unspecified, unspecified trimester: Secondary | ICD-10-CM

## 2019-07-31 DIAGNOSIS — O3433 Maternal care for cervical incompetence, third trimester: Secondary | ICD-10-CM | POA: Insufficient documentation

## 2019-07-31 DIAGNOSIS — O09212 Supervision of pregnancy with history of pre-term labor, second trimester: Secondary | ICD-10-CM

## 2019-07-31 DIAGNOSIS — O0992 Supervision of high risk pregnancy, unspecified, second trimester: Secondary | ICD-10-CM

## 2019-07-31 DIAGNOSIS — O09299 Supervision of pregnancy with other poor reproductive or obstetric history, unspecified trimester: Secondary | ICD-10-CM

## 2019-07-31 NOTE — Progress Notes (Signed)
17p Rx called into [facility].   Injection administration provided to patient and return demonstration performed.

## 2019-07-31 NOTE — Progress Notes (Signed)
   PRENATAL VISIT NOTE  Subjective:  Kathy Wilkerson is a 34 y.o. G3P0110 at [redacted]w[redacted]d being seen today for ongoing prenatal care. Due to language barrier, an Arabic interpreter was present during the history-taking and subsequent discussion (and for part of the physical exam) with this patient. She is currently monitored for the following issues for this high-risk pregnancy and has Previous preterm delivery, antepartum; Language barrier; History of incompetent cervix, currently pregnant; Supervision of high risk pregnancy, antepartum; and Cervical cerclage suture present, antepartum on their problem list.  Patient reports no complaints.  Contractions: Not present. Vag. Bleeding: None.  Movement: Absent. Denies leaking of fluid.   The following portions of the patient's history were reviewed and updated as appropriate: allergies, current medications, past family history, past medical history, past social history, past surgical history and problem list.   Objective:   Vitals:   07/31/19 1051  BP: 110/70  Pulse: 77  Weight: 119 lb (54 kg)    Fetal Status: Fetal Heart Rate (bpm): 154   Movement: Absent     General:  Alert, oriented and cooperative. Patient is in no acute distress.  Skin: Skin is warm and dry. No rash noted.   Cardiovascular: Normal heart rate noted  Respiratory: Normal respiratory effort, no problems with respiration noted  Abdomen: Soft, gravid, appropriate for gestational age.  Pain/Pressure: Present     Pelvic: Cervical exam deferred        Extremities: Normal range of motion.  Edema: None  Mental Status: Normal mood and affect. Normal behavior. Normal judgment and thought content.   Assessment and Plan:  Pregnancy: G3P0110 at [redacted]w[redacted]d 1. Previous preterm delivery, antepartum 2. Cervical cerclage suture present, antepartum 3. History of incompetent cervix, currently pregnant Doing well s/p cerclage. Counseled about 17P, ordered. Will start next week, auto-inject weekly from  weeks 16-36.  4. Other decreased white blood cell (WBC) count Unknown etiology, will follow for now. - CBC CBC Latest Ref Rng & Units 07/10/2019 06/25/2019 10/06/2018  WBC 4.0 - 10.5 K/uL 2.8(L) 2.4(LL) 6.1  Hemoglobin 12.0 - 15.0 g/dL 04.5 40.9 8.1(X)  Hematocrit 36.0 - 46.0 % 37.0 36.9 25.5(L)  Platelets 150 - 400 K/uL 191 199 160    5. Supervision of high risk pregnancy, antepartum Low risk female on NIPS. AFP only ordered,will follow up results and manage accordingly. Anatomy scan on 08/25/18.  - AFP, Serum, Open Spina Bifida No other complaints or concerns.  Routine obstetric precautions reviewed.  Please refer to After Visit Summary for other counseling recommendations.   Return in about 25 days (around 08/25/2019) for OFFICE [facility] Visit wiht Arabic interpreter, has MFM scan that day..  Future Appointments  Date Time Provider Department Center  08/25/2019 10:45 AM WH-MFC [place] 2 WH-MFCUS MFC-US  08/25/2019 10:50 AM WH-MFC NURSE WH-MFC MFC-US    Jaynie Collins, MD

## 2019-07-31 NOTE — Patient Instructions (Signed)
MyChart       :  [id number] Expires: 08/09/2019  9:53 AM    [email or web address]      MyChart.       "MyChart"     [place]    .         [[phone]] [phone].         .     RE: MyChart  Dear Ms. Piacentini  MyChart makes it easy for you to view your health information - all in one secure location - from any computer or mobile device at any time. Use the activation code below to enroll in MyChart online at [email or web address]   Once your account is activated, you can:  Marland Kitchen View your test results. . Communicate securely with your physician's office.  . View your medical history, allergies, medications, and immunizations. . Receive care virtually through an e-Visit.   If you are over 18, you may use features of MyChart to manage the health information of your spouse, children or others you care for.  Download child and adult access forms at [email or web address].    As you activate your MyChart account and need any technical assistance, please call the MyChart technical support line at [phone]).  Be sure to also download the MyChart app for your mobile device.   Thank you for choosing [facility] for your family's health care needs!   MyChart Activation Code:  [email or web address] Expires: 08/09/2019  9:53 AM    [facility]  [address] [place], [place] [address] Second Trimester of Pregnancy The second trimester is from week 14 through week 27 (months 4 through 6). The second trimester is often a time when you feel your best. Your body has adjusted to being pregnant, and you begin to feel better physically. Usually, morning sickness has lessened or quit completely, you may have more energy, and you  may have an increase in appetite. The second trimester is also a time when the fetus is growing rapidly. At the end of the sixth month, the fetus is about 9 inches long and weighs about 1 pounds. You will likely begin to feel the baby move (quickening) between 16 and 20 weeks of pregnancy. Body changes during your second trimester Your body continues to go through many changes during your second trimester. The changes vary from woman to woman.  Your weight will continue to increase. You will notice your lower abdomen bulging out.  You may begin to get stretch marks on your hips, abdomen, and breasts.  You may develop headaches that can be relieved by medicines. The medicines should be approved by your health care provider.  You may urinate more often because the fetus is pressing on your bladder.  You may develop or continue to have heartburn as a result of your pregnancy.  You may develop constipation because certain hormones are causing the muscles that push waste through your intestines to slow down.  You may develop hemorrhoids or swollen, bulging veins (varicose veins).  You may have back pain. This is caused by: ? Weight gain. ? Pregnancy hormones that are relaxing the joints in your pelvis. ? A shift in weight and the muscles that support your balance.  Your breasts will continue to grow and they will continue to become tender.  Your gums may bleed and may be sensitive to brushing and flossing.  Dark spots or blotches (chloasma, mask of pregnancy) may develop on your face. This will  likely fade after the baby is born.  A dark line from your belly button to the pubic area (linea nigra) may appear. This will likely fade after the baby is born.  You may have changes in your hair. These can include thickening of your hair, rapid growth, and changes in texture. Some women also have hair loss during or after pregnancy, or hair that feels dry or thin. Your hair will most likely return  to normal after your baby is born. What to expect at prenatal visits During a routine prenatal visit:  You will be weighed to make sure you and the fetus are growing normally.  Your blood pressure will be taken.  Your abdomen will be measured to track your baby's growth.  The fetal heartbeat will be listened to.  Any test results from the previous visit will be discussed. Your health care provider may ask you:  How you are feeling.  If you are feeling the baby move.  If you have had any abnormal symptoms, such as leaking fluid, bleeding, severe headaches, or abdominal cramping.  If you are using any tobacco products, including cigarettes, chewing tobacco, and electronic cigarettes.  If you have any questions. Other tests that may be performed during your second trimester include:  Blood tests that check for: ? Low iron levels (anemia). ? High blood sugar that affects pregnant women (gestational diabetes) between 29 and 28 weeks. ? Rh antibodies. This is to check for a protein on red blood cells (Rh factor).  Urine tests to check for infections, diabetes, or protein in the urine.  An ultrasound to confirm the proper growth and development of the baby.  An amniocentesis to check for possible genetic problems.  Fetal screens for spina bifida and Down syndrome.  HIV (human immunodeficiency virus) testing. Routine prenatal testing includes screening for HIV, unless you choose not to have this test. Follow these instructions at home: Medicines  Follow your health care provider's instructions regarding medicine use. Specific medicines may be either safe or unsafe to take during pregnancy.  Take a prenatal vitamin that contains at least 600 micrograms (mcg) of folic acid.  If you develop constipation, try taking a stool softener if your health care provider approves. Eating and drinking   Eat a balanced diet that includes fresh fruits and vegetables, whole grains, good  sources of protein such as meat, eggs, or tofu, and low-fat dairy. Your health care provider will help you determine the amount of weight gain that is right for you.  Avoid raw meat and uncooked cheese. These carry germs that can cause birth defects in the baby.  If you have low calcium intake from food, talk to your health care provider about whether you should take a daily calcium supplement.  Limit foods that are high in fat and processed sugars, such as fried and sweet foods.  To prevent constipation: ? Drink enough fluid to keep your urine clear or pale yellow. ? Eat foods that are high in fiber, such as fresh fruits and vegetables, whole grains, and beans. Activity  Exercise only as directed by your health care provider. Most women can continue their usual exercise routine during pregnancy. Try to exercise for 30 minutes at least 5 days a week. Stop exercising if you experience uterine contractions.  Avoid heavy lifting, wear low heel shoes, and practice good posture.  A sexual relationship may be continued unless your health care provider directs you otherwise. Relieving pain and discomfort  Wear a good  support bra to prevent discomfort from breast tenderness.  Take warm sitz baths to soothe any pain or discomfort caused by hemorrhoids. Use hemorrhoid cream if your health care provider approves.  Rest with your legs elevated if you have leg cramps or low back pain.  If you develop varicose veins, wear support hose. Elevate your feet for 15 minutes, 3-4 times a day. Limit salt in your diet. Prenatal Care  Write down your questions. Take them to your prenatal visits.  Keep all your prenatal visits as told by your health care provider. This is important. Safety  Wear your seat belt at all times when driving.  Make a list of emergency phone numbers, including numbers for family, friends, the hospital, and police and fire departments. General instructions  Ask your health  care provider for a referral to a local prenatal education class. Begin classes no later than the beginning of month 6 of your pregnancy.  Ask for help if you have counseling or nutritional needs during pregnancy. Your health care provider can offer advice or refer you to specialists for help with various needs.  Do not use hot tubs, steam rooms, or saunas.  Do not douche or use tampons or scented sanitary pads.  Do not cross your legs for long periods of time.  Avoid cat litter boxes and soil used by cats. These carry germs that can cause birth defects in the baby and possibly loss of the fetus by miscarriage or stillbirth.  Avoid all smoking, herbs, alcohol, and unprescribed drugs. Chemicals in these products can affect the formation and growth of the baby.  Do not use any products that contain nicotine or tobacco, such as cigarettes and e-cigarettes. If you need help quitting, ask your health care provider.  Visit your dentist if you have not gone yet during your pregnancy. Use a soft toothbrush to brush your teeth and be gentle when you floss. Contact a health care provider if:  You have dizziness.  You have mild pelvic cramps, pelvic pressure, or nagging pain in the abdominal area.  You have persistent nausea, vomiting, or diarrhea.  You have a bad smelling vaginal discharge.  You have pain when you urinate. Get help right away if:  You have a fever.  You are leaking fluid from your vagina.  You have spotting or bleeding from your vagina.  You have severe abdominal cramping or pain.  You have rapid weight gain or weight loss.  You have shortness of breath with chest pain.  You notice sudden or extreme swelling of your face, hands, ankles, feet, or legs.  You have not felt your baby move in over an hour.  You have severe headaches that do not go away when you take medicine.  You have vision changes. Summary  The second trimester is from week 14 through week 27  (months 4 through 6). It is also a time when the fetus is growing rapidly.  Your body goes through many changes during pregnancy. The changes vary from woman to woman.  Avoid all smoking, herbs, alcohol, and unprescribed drugs. These chemicals affect the formation and growth your baby.  Do not use any tobacco products, such as cigarettes, chewing tobacco, and e-cigarettes. If you need help quitting, ask your health care provider.  Contact your health care provider if you have any questions. Keep all prenatal visits as told by your health care provider. This is important. This information is not intended to replace advice given to you by your  health care provider. Make sure you discuss any questions you have with your health care provider. Document Revised: 09/27/2018 Document Reviewed: 07/11/2016 Elsevier Patient Education  2020 [employer].

## 2019-07-31 NOTE — Telephone Encounter (Signed)
Notified by [facility] compounding pharmacy their system is still showing BCBS as active and medicaid is not wanting to cover [facility] Rx. BCBS is not currently active.   Called patient with pacific interpreter [phone] and discussed she needs to contact her medicaid case worker and let them know BCBS is no longer active and to drop it from their system then her insurance can be ran. Patient verbalized understanding. Provided callback number to [facility] for her to call when she hears from her case worker. Patient verbalized understanding & had no questions.

## 2019-08-02 LAB — AFP, SERUM, OPEN SPINA BIFIDA
AFP MoM: 1.11
AFP Value: 40.7 ng/mL
Gest. Age on Collection Date: 15.6 wk
Maternal Age At EDD: 33.9 a
OSBR Risk 1 IN: 8647
Test Results:: NEGATIVE
Weight: 119 [lb_av]

## 2019-08-02 LAB — CBC
Hematocrit: 33.6 % — ABNORMAL LOW (ref 34.0–46.6)
Hemoglobin: 11.9 g/dL (ref 11.1–15.9)
MCH: 32.5 pg (ref 26.6–33.0)
MCHC: 35.4 g/dL (ref 31.5–35.7)
MCV: 92 fL (ref 79–97)
Platelets: 197 x10E3/uL (ref 150–450)
RBC: 3.66 x10E6/uL — ABNORMAL LOW (ref 3.77–5.28)
RDW: 13.5 % (ref 11.7–15.4)
WBC: 3.2 x10E3/uL — ABNORMAL LOW (ref 3.4–10.8)

## 2019-08-03 ENCOUNTER — Encounter: Payer: Self-pay | Admitting: Obstetrics & Gynecology

## 2019-08-03 DIAGNOSIS — D72819 Decreased white blood cell count, unspecified: Secondary | ICD-10-CM | POA: Insufficient documentation

## 2019-08-05 ENCOUNTER — Telehealth: Payer: Self-pay

## 2019-08-05 NOTE — Telephone Encounter (Signed)
Pt's husband left VM on nurse line stating medicaid had previously refused to pay for pt's medication. Per chart review it appears pt had issues with Makena injections being covered by medicaid due to prior BCBS coverage. Pt's husband states all issues should be resolved. He states he called the pharmacy at [phone]; verified he contacted the correct pharmacy. He reports that when he called the pharmacy they stated they did not have the pt's information in their records.

## 2019-08-06 NOTE — Telephone Encounter (Signed)
Called compounding pharmacy. Pharmacy staff still unable to fill Makena injection; states Medicaid is still requesting medication be covered by primary insurance.   Called pt at 52 with [facility] interpreter [place] ID [phone]. Pt states she would like for me to speak with her husband regarding insurance issues. States he will be available in an hour. Explained I will call back later this afternoon.   Called pt's husband at 47 with [facility] interpreter [place] ID [phone]. Pt's husband states he will not need interpreter. Ended call with interpreter, called pt's husband back separately. He reports that he has contacted Medicaid and the compounding pharmacy this afternoon. States the issue is resolved and the medication will be shipped tomorrow. Pt plans to give self-injections at home. Encouraged pt's husband to call with any concerns regarding injections.

## 2019-08-25 ENCOUNTER — Ambulatory Visit (INDEPENDENT_AMBULATORY_CARE_PROVIDER_SITE_OTHER): Payer: Medicaid Other | Admitting: Obstetrics and Gynecology

## 2019-08-25 ENCOUNTER — Ambulatory Visit (HOSPITAL_COMMUNITY)
Admission: RE | Admit: 2019-08-25 | Discharge: 2019-08-25 | Disposition: A | Discharge status: Home / Self Care | Payer: Medicaid Other | Source: Ambulatory Visit | Attending: Obstetrics and Gynecology | Admitting: Obstetrics and Gynecology

## 2019-08-25 ENCOUNTER — Ambulatory Visit (HOSPITAL_COMMUNITY): Payer: Medicaid Other | Admitting: *Deleted

## 2019-08-25 ENCOUNTER — Other Ambulatory Visit: Payer: Self-pay

## 2019-08-25 ENCOUNTER — Encounter (HOSPITAL_COMMUNITY): Payer: Self-pay

## 2019-08-25 ENCOUNTER — Other Ambulatory Visit (HOSPITAL_COMMUNITY): Payer: Self-pay | Admitting: *Deleted

## 2019-08-25 VITALS — BP 103/73 | HR 86 | Wt 120.7 lb

## 2019-08-25 DIAGNOSIS — O09892 Supervision of other high risk pregnancies, second trimester: Secondary | ICD-10-CM

## 2019-08-25 DIAGNOSIS — O0990 Supervision of high risk pregnancy, unspecified, unspecified trimester: Secondary | ICD-10-CM

## 2019-08-25 DIAGNOSIS — O3430 Maternal care for cervical incompetence, unspecified trimester: Secondary | ICD-10-CM

## 2019-08-25 DIAGNOSIS — O09219 Supervision of pregnancy with history of pre-term labor, unspecified trimester: Secondary | ICD-10-CM

## 2019-08-25 DIAGNOSIS — O09292 Supervision of pregnancy with other poor reproductive or obstetric history, second trimester: Secondary | ICD-10-CM

## 2019-08-25 DIAGNOSIS — O039 Complete or unspecified spontaneous abortion without complication: Secondary | ICD-10-CM

## 2019-08-25 DIAGNOSIS — Z603 Acculturation difficulty: Secondary | ICD-10-CM

## 2019-08-25 DIAGNOSIS — O09299 Supervision of pregnancy with other poor reproductive or obstetric history, unspecified trimester: Secondary | ICD-10-CM

## 2019-08-25 DIAGNOSIS — Z3A19 19 weeks gestation of pregnancy: Secondary | ICD-10-CM | POA: Diagnosis not present

## 2019-08-25 DIAGNOSIS — O0992 Supervision of high risk pregnancy, unspecified, second trimester: Secondary | ICD-10-CM

## 2019-08-25 DIAGNOSIS — Z789 Other specified health status: Secondary | ICD-10-CM

## 2019-08-25 DIAGNOSIS — O3432 Maternal care for cervical incompetence, second trimester: Secondary | ICD-10-CM | POA: Diagnosis not present

## 2019-08-25 DIAGNOSIS — O09212 Supervision of pregnancy with history of pre-term labor, second trimester: Secondary | ICD-10-CM

## 2019-08-25 DIAGNOSIS — D72819 Decreased white blood cell count, unspecified: Secondary | ICD-10-CM

## 2019-08-25 NOTE — Progress Notes (Signed)
Subjective:  Kathy Wilkerson is a 34 y.o. G3P0110 at [redacted]w[redacted]d being seen today for ongoing prenatal care.  She is currently monitored for the following issues for this high-risk pregnancy and has Previous preterm delivery, antepartum; Language barrier; History of incompetent cervix, currently pregnant; Supervision of high risk pregnancy, antepartum; Cervical cerclage suture present, antepartum; and Leukopenia on their problem list.  Patient reports no complaints.  Contractions: Not present. Vag. Bleeding: None.  Movement: Present. Denies leaking of fluid.   The following portions of the patient's history were reviewed and updated as appropriate: allergies, current medications, past family history, past medical history, past social history, past surgical history and problem list. Problem list updated.  Objective:   Vitals:   08/25/19 0933  BP: 103/73  Pulse: 86  Weight: 120 lb 11.2 oz (54.7 kg)    Fetal Status: Fetal Heart Rate (bpm): 146   Movement: Present     General:  Alert, oriented and cooperative. Patient is in no acute distress.  Skin: Skin is warm and dry. No rash noted.   Cardiovascular: Normal heart rate noted  Respiratory: Normal respiratory effort, no problems with respiration noted  Abdomen: Soft, gravid, appropriate for gestational age. Pain/Pressure: Absent     Pelvic:  Cervical exam deferred        Extremities: Normal range of motion.  Edema: None  Mental Status: Normal mood and affect. Normal behavior. Normal judgment and thought content.   Urinalysis:      Assessment and Plan:  Pregnancy: G3P0110 at [redacted]w[redacted]d  1. Supervision of high risk pregnancy, antepartum Stable U/S today 2. History of incompetent cervix, currently pregnant Stable Continue with weekly 17OHP  3. Cervical cerclage suture present, antepartum Stable As above  4. Language barrier Live interrupter used during today's visit  5. Leukopenia, unspecified type Stable Will repeat CBC with 28 week  labs  6. Previous preterm delivery, antepartum See above  Preterm labor symptoms and general obstetric precautions including but not limited to vaginal bleeding, contractions, leaking of fluid and fetal movement were reviewed in detail with the patient. Please refer to After Visit Summary for other counseling recommendations.  Return in about 4 weeks (around 09/22/2019) for OB visit, face to face, MD provider.   [name], MD

## 2019-08-25 NOTE — Patient Instructions (Signed)

## 2019-09-23 ENCOUNTER — Ambulatory Visit (HOSPITAL_COMMUNITY)
Admission: RE | Admit: 2019-09-23 | Discharge: 2019-09-23 | Disposition: A | Discharge status: Home / Self Care | Payer: Medicaid Other | Source: Ambulatory Visit | Attending: Obstetrics and Gynecology | Admitting: Obstetrics and Gynecology

## 2019-09-23 ENCOUNTER — Other Ambulatory Visit: Payer: Self-pay

## 2019-09-23 ENCOUNTER — Ambulatory Visit (HOSPITAL_COMMUNITY): Payer: Medicaid Other | Admitting: *Deleted

## 2019-09-23 ENCOUNTER — Encounter (HOSPITAL_COMMUNITY): Payer: Self-pay

## 2019-09-23 DIAGNOSIS — O09299 Supervision of pregnancy with other poor reproductive or obstetric history, unspecified trimester: Secondary | ICD-10-CM

## 2019-09-23 DIAGNOSIS — O3432 Maternal care for cervical incompetence, second trimester: Secondary | ICD-10-CM

## 2019-09-23 DIAGNOSIS — O0990 Supervision of high risk pregnancy, unspecified, unspecified trimester: Secondary | ICD-10-CM | POA: Insufficient documentation

## 2019-09-23 DIAGNOSIS — Z3A23 23 weeks gestation of pregnancy: Secondary | ICD-10-CM | POA: Diagnosis not present

## 2019-09-23 DIAGNOSIS — O09892 Supervision of other high risk pregnancies, second trimester: Secondary | ICD-10-CM | POA: Diagnosis present

## 2019-09-24 ENCOUNTER — Ambulatory Visit (INDEPENDENT_AMBULATORY_CARE_PROVIDER_SITE_OTHER): Payer: Medicaid Other | Admitting: Family Medicine

## 2019-09-24 ENCOUNTER — Encounter: Payer: Medicaid Other | Admitting: Obstetrics and Gynecology

## 2019-09-24 VITALS — BP 98/64 | HR 79 | Wt 125.9 lb

## 2019-09-24 DIAGNOSIS — O09212 Supervision of pregnancy with history of pre-term labor, second trimester: Secondary | ICD-10-CM

## 2019-09-24 DIAGNOSIS — O3430 Maternal care for cervical incompetence, unspecified trimester: Secondary | ICD-10-CM

## 2019-09-24 DIAGNOSIS — O09219 Supervision of pregnancy with history of pre-term labor, unspecified trimester: Secondary | ICD-10-CM

## 2019-09-24 DIAGNOSIS — Z789 Other specified health status: Secondary | ICD-10-CM

## 2019-09-24 DIAGNOSIS — Z758 Other problems related to medical facilities and other health care: Secondary | ICD-10-CM

## 2019-09-24 DIAGNOSIS — D72819 Decreased white blood cell count, unspecified: Secondary | ICD-10-CM

## 2019-09-24 DIAGNOSIS — Z3A23 23 weeks gestation of pregnancy: Secondary | ICD-10-CM

## 2019-09-24 DIAGNOSIS — O3432 Maternal care for cervical incompetence, second trimester: Secondary | ICD-10-CM

## 2019-09-24 DIAGNOSIS — O0990 Supervision of high risk pregnancy, unspecified, unspecified trimester: Secondary | ICD-10-CM

## 2019-09-24 DIAGNOSIS — O09299 Supervision of pregnancy with other poor reproductive or obstetric history, unspecified trimester: Secondary | ICD-10-CM

## 2019-09-24 MED ORDER — BLOOD PRESSURE KIT DEVI: 1.0000 | Freq: Once | 0 refills | Status: AC

## 2019-09-24 NOTE — Addendum Note (Signed)
Addended by: Maxwell Marion E on: 09/24/2019 11:38 AM   Modules accepted: Orders

## 2019-09-24 NOTE — Patient Instructions (Addendum)
Second Trimester of Pregnancy The second trimester is from week 14 through week 27 (months 4 through 6). The second trimester is often a time when you feel your best. Your body has adjusted to being pregnant, and you begin to feel better physically. Usually, morning sickness has lessened or quit completely, you may have more energy, and you may have an increase in appetite. The second trimester is also a time when the fetus is growing rapidly. At the end of the sixth month, the fetus is about 9 inches long and weighs about 1 pounds. You will likely begin to feel the baby move (quickening) between 16 and 20 weeks of pregnancy. Body changes during your second trimester Your body continues to go through many changes during your second trimester. The changes vary from woman to woman.  Your weight will continue to increase. You will notice your lower abdomen bulging out.  You may begin to get stretch marks on your hips, abdomen, and breasts.  You may develop headaches that can be relieved by medicines. The medicines should be approved by your health care provider.  You may urinate more often because the fetus is pressing on your bladder.  You may develop or continue to have heartburn as a result of your pregnancy.  You may develop constipation because certain hormones are causing the muscles that push waste through your intestines to slow down.  You may develop hemorrhoids or swollen, bulging veins (varicose veins).  You may have back pain. This is caused by: ? Weight gain. ? Pregnancy hormones that are relaxing the joints in your pelvis. ? A shift in weight and the muscles that support your balance.  Your breasts will continue to grow and they will continue to become tender.  Your gums may bleed and may be sensitive to brushing and flossing.  Dark spots or blotches (chloasma, mask of pregnancy) may develop on your face. This will likely fade after the baby is born.  A dark line from your  belly button to the pubic area (linea nigra) may appear. This will likely fade after the baby is born.  You may have changes in your hair. These can include thickening of your hair, rapid growth, and changes in texture. Some women also have hair loss during or after pregnancy, or hair that feels dry or thin. Your hair will most likely return to normal after your baby is born. What to expect at prenatal visits During a routine prenatal visit:  You will be weighed to make sure you and the fetus are growing normally.  Your blood pressure will be taken.  Your abdomen will be measured to track your baby's growth.  The fetal heartbeat will be listened to.  Any test results from the previous visit will be discussed. Your health care provider may ask you:  How you are feeling.  If you are feeling the baby move.  If you have had any abnormal symptoms, such as leaking fluid, bleeding, severe headaches, or abdominal cramping.  If you are using any tobacco products, including cigarettes, chewing tobacco, and electronic cigarettes.  If you have any questions. Other tests that may be performed during your second trimester include:  Blood tests that check for: ? Low iron levels (anemia). ? High blood sugar that affects pregnant women (gestational diabetes) between 24 and 28 weeks. ? Rh antibodies. This is to check for a protein on red blood cells (Rh factor).  Urine tests to check for infections, diabetes, or protein in the   urine.  An ultrasound to confirm the proper growth and development of the baby.  An amniocentesis to check for possible genetic problems.  Fetal screens for spina bifida and Down syndrome.  HIV (human immunodeficiency virus) testing. Routine prenatal testing includes screening for HIV, unless you choose not to have this test. Follow these instructions at home: Medicines  Follow your health care provider's instructions regarding medicine use. Specific medicines may be  either safe or unsafe to take during pregnancy.  Take a prenatal vitamin that contains at least 600 micrograms (mcg) of folic acid.  If you develop constipation, try taking a stool softener if your health care provider approves. Eating and drinking   Eat a balanced diet that includes fresh fruits and vegetables, whole grains, good sources of protein such as meat, eggs, or tofu, and low-fat dairy. Your health care provider will help you determine the amount of weight gain that is right for you.  Avoid raw meat and uncooked cheese. These carry germs that can cause birth defects in the baby.  If you have low calcium intake from food, talk to your health care provider about whether you should take a daily calcium supplement.  Limit foods that are high in fat and processed sugars, such as fried and sweet foods.  To prevent constipation: ? Drink enough fluid to keep your urine clear or pale yellow. ? Eat foods that are high in fiber, such as fresh fruits and vegetables, whole grains, and beans. Activity  Exercise only as directed by your health care provider. Most women can continue their usual exercise routine during pregnancy. Try to exercise for 30 minutes at least 5 days a week. Stop exercising if you experience uterine contractions.  Avoid heavy lifting, wear low heel shoes, and practice good posture.  A sexual relationship may be continued unless your health care provider directs you otherwise. Relieving pain and discomfort  Wear a good support bra to prevent discomfort from breast tenderness.  Take warm sitz baths to soothe any pain or discomfort caused by hemorrhoids. Use hemorrhoid cream if your health care provider approves.  Rest with your legs elevated if you have leg cramps or low back pain.  If you develop varicose veins, wear support hose. Elevate your feet for 15 minutes, 3-4 times a day. Limit salt in your diet. Prenatal Care  Write down your questions. Take them to  your prenatal visits.  Keep all your prenatal visits as told by your health care provider. This is important. Safety  Wear your seat belt at all times when driving.  Make a list of emergency phone numbers, including numbers for family, friends, the hospital, and police and fire departments. General instructions  Ask your health care provider for a referral to a local prenatal education class. Begin classes no later than the beginning of month 6 of your pregnancy.  Ask for help if you have counseling or nutritional needs during pregnancy. Your health care provider can offer advice or refer you to specialists for help with various needs.  Do not use hot tubs, steam rooms, or saunas.  Do not douche or use tampons or scented sanitary pads.  Do not cross your legs for long periods of time.  Avoid cat litter boxes and soil used by cats. These carry germs that can cause birth defects in the baby and possibly loss of the fetus by miscarriage or stillbirth.  Avoid all smoking, herbs, alcohol, and unprescribed drugs. Chemicals in these products can affect the formation   and growth of the baby.  Do not use any products that contain nicotine or tobacco, such as cigarettes and e-cigarettes. If you need help quitting, ask your health care provider.  Visit your dentist if you have not gone yet during your pregnancy. Use a soft toothbrush to brush your teeth and be gentle when you floss. Contact a health care provider if:  You have dizziness.  You have mild pelvic cramps, pelvic pressure, or nagging pain in the abdominal area.  You have persistent nausea, vomiting, or diarrhea.  You have a bad smelling vaginal discharge.  You have pain when you urinate. Get help right away if:  You have a fever.  You are leaking fluid from your vagina.  You have spotting or bleeding from your vagina.  You have severe abdominal cramping or pain.  You have rapid weight gain or weight loss.  You have  shortness of breath with chest pain.  You notice sudden or extreme swelling of your face, hands, ankles, feet, or legs.  You have not felt your baby move in over an hour.  You have severe headaches that do not go away when you take medicine.  You have vision changes. Summary  The second trimester is from week 14 through week 27 (months 4 through 6). It is also a time when the fetus is growing rapidly.  Your body goes through many changes during pregnancy. The changes vary from woman to woman.  Avoid all smoking, herbs, alcohol, and unprescribed drugs. These chemicals affect the formation and growth your baby.  Do not use any tobacco products, such as cigarettes, chewing tobacco, and e-cigarettes. If you need help quitting, ask your health care provider.  Contact your health care provider if you have any questions. Keep all prenatal visits as told by your health care provider. This is important. This information is not intended to replace advice given to you by your health care provider. Make sure you discuss any questions you have with your health care provider. Document Revised: 09/27/2018 Document Reviewed: 07/11/2016 Elsevier Patient Education  2020 Elsevier Inc.  AREA PEDIATRIC/FAMILY PRACTICE PHYSICIANS  [place] ([address]) . [facility] o Chambliss, MD; Eniola, MD; Hale, MD; Hensel, MD; McDiarmid, MD; McIntyer, MD; Neal, MD; Walden, MD o [address] [place], [place] [address] o [phone] o Mon-Fri 8:30-12:30, 1:30-5:00 o Providers come to see babies at [facility] o Accepting Medicaid . [facility] o Limited providers who accept newborns: Koirala, MD; Morrow, MD; Wolters, MD o [address], [place], [place] [address] o [phone] o Mon-Fri 8:00-5:30 o Babies seen by providers at [facility] o Does NOT accept Medicaid o Please call early in hospitalization for appointment  (limited availability)  . [facility] o Mulberry, MD o [address] , [place] [address] o [phone] o Mon, Tue, Thur, Fri 8:30-5:00, Wed 10:00-7:00 (closed 1-2pm) o Babies seen by [facility] providers o Accepting Medicaid . [facility] o Rubin, MD o [address], , [place] [address] o [phone] o Mon-Fri 8:30-5:00, Sat 8:30-12:00 o Provider comes to see babies at [facility] o Accepting Medicaid o Must have been referred from current patients or contacted office prior to delivery . [facility] ([facility]) o Brown, MD; Chandler, MD; Ettefagh, MD; Grant, MD; Lester, MD; McCormick, MD; McQueen, MD; Prose, MD; Simha, MD; Stanley, MD; Stryffeler, NP;   Tebben, NP o [address], [place], [place] [address] o [phone] o Mon, Tue, Thur, Fri 8:30-5:30, Wed 9:30-5:30, Sat 8:30-12:30 o Babies seen by [facility] providers o Accepting Medicaid o Only accepting infants of first-time parents or siblings of current patients o Hospital discharge coordinator will make follow-up appointment . Jack Amos o [address], [place], [place]  [address] o [phone]   Fax - [phone] . [facility] o [address], [place], [place]  [address] o Phone - [phone]   Fax - [phone] . Shilpa Gosrani o [address], [place], [place]  [address] o [phone]  [place] ([address]) . [facility] o Bates, MD; [place]field, MD; Cooper, Cox, MD; MD; Davis, MD; Dovico, MD; Ettefaugh, MD; Little, MD; Lowe, MD; Keiffer, MD; Melvin, MD; Sumner, MD; Williams, MD o [address], [place], [place] [address] o [phone] o Mon-Fri 8:30-5:00 (extended evenings Mon-Thur as needed), Sat-Sun 10:00-1:00 o Providers come to see babies at [facility] o Accepting Medicaid for families of first-time babies  and families with all children in the household age 3 and under. Must register with office prior to making appointment (M-F only). . [facility] o Henson, NP; Knapp, MD; Lalonde, MD; Tysinger, PA o [address] [place], [place] [address] o [phone] o Mon-Fri 8:00-5:00 o Babies seen by providers at [facility] o Does NOT accept Medicaid/Commercial Insurance Only . [facility] ([facility])  o Artis, MD; Barnes, MD; Bratton, MD; Coccaro, MD; Lockett Gardner, MD; Kramer, MD; Marshall, MD; Netherton, MD; Poleto, MD; Skinner, MD o [address] [place], [place] [address] o [phone] o Mon-Fri 8:30-5:30, Sat (Oct.-Mar.) 9:00-1:00 o Babies seen by providers at [facility] o Accepting Medicaid  [place] ([address]) . [facility] o Reid, MD; Warner, MD o [address], [place], [place] [address] o [phone] o Mon-Fri 8:30-5:00, Sat 8:30-12:00 o Providers come to see babies at [facility] o Does NOT accept Medicaid . [facility] o Becker, PA; Hagler, MD; Scifres, PA; Sun, MD; Swayne, MD o [address], [place], [place] [address] o [phone] o Mon-Fri 8:00-5:00 o Babies seen by providers at [facility] o Does NOT accept Medicaid o Only accepting babies of parents who are patients o Please call early in hospitalization for appointment (limited availability) . [facility] o Clark, MD; Frye, MD; Kelleher, MD; Mack, NP; Miller, MD; O'Keller, MD; Patterson, NP; Pudlo, MD; Puzio, MD; Thomas, MD; Tucker, MD; Twiselton, MD o [address], [place], [place] [address] o [phone] o Mon-Fri 8:00-5:00, Sat 9:00-12:00 o Providers come to see babies at [facility] o Does NOT accept Medicaid  [place] ([address]) . [facility] o Limited providers accepting new  patients: Brake, NP; Wharton, PA o [address], [place], [place] [address] o [phone] o Mon-Fri 8:00-5:00 o Babies seen by providers at [facility] o Does NOT accept Medicaid o Only accepting babies of parents who are patients o Please call early in hospitalization for appointment (limited availability) . [facility] o Gay, MD; Quinlan, MD o [address] [place], [place] [address] o [phone] (press 1 to schedule appointment) o Mon-Fri 8:00-5:00 o Providers come to see babies at [facility] o Does NOT accept Medicaid . [facility] o Mazer, MD o [address] [place], [place] [address] o [phone] o Mon-Fri   8:30-5:00 (lunch 12:30-1:00), extended hours by appointment only Wed 5:00-6:30 o Babies seen by [facility] providers o Accepting Medicaid . [facility] o Banks, MD; Jordan, MD; Koberlein, MD o [address], [place], [place] [address] o [phone] o Mon-Fri 8:00-5:00 o Babies seen by [facility] providers o Does NOT accept Medicaid . [facility] o Parker, MD; Hunter, MD; Wallace, DO o [address] [place], [place] [address] o [phone] o Mon-Fri 8:00-5:00 o Babies seen by [facility] providers o Does NOT accept Medicaid . [facility] o Brandon, PA; Brecken, PA; Christy, NP; Dees, MD; DeClaire, MD; DeWeese, MD; Hansen, NP; Mills, NP; Parrish, NP; Smoot, NP; Summer, MD; Vapne, MD o [address] [place], [place] [address] o [phone] o Mon-Fri 8:30-5:00, Sat 10:00-1:00 o Providers come to see babies at [facility] o Does NOT accept Medicaid o Free prenatal information session Tuesdays at 4:45pm . [facility] o Bouska, MD; Gordon, PA; Jeffery, PA; Weber, PA o [address] [place] [address] o [phone] o Mon-Fri 7:30-5:30 o Babies seen by [facility] providers . [facility] o [address], [place], [place]  [address] o [phone]   Fax - [phone]  [place] ([address] & [address]) . [facility] o [place]ese, [place] o [address] [place], [place] [address] o [phone] o Mon-Thur 8:00-6:00 o Providers come to see babies at [facility] o Accepting Medicaid . [facility] o Anderson, NP; Badger, MD; Beal, PA; Spencer, PA o [address] [place], [place] [address] o [phone] o Mon-Thur 7:30-7:30, Fri 7:30-4:30 o Babies seen by [facility] providers o Accepting Medicaid . [facility] o Agbuya, MD; Klett, NP; Romgoolam, MD o [address], [place], [place] [address] o [phone] o Mon-Fri 8:30-5:00, Sat 8:30-12:00 o Providers come to see babies at [facility] o Accepting Medicaid o Must have "Meet & Greet" appointment at office prior to delivery . [facility] ([facility] o McCord, MD; Wallace, MD; Wood, MD o [address], [place], [place] [address] o [phone] o Mon-Wed 8:00-6:00, Thur-Fri 8:00-5:00, Sat 9:00-12:00 o Providers come to see babies at [facility] o Does NOT accept Medicaid o Only accepting siblings of current patients . [facility]  o [address], [place], [place]  [address] o [phone]   Fax - [phone] . [facility] o [address], [place], [place]  [address] o [phone]   Fax - [phone]  [place] ([address] & [address]) . [facility] o Cirigliano, DO; Matthews, DO o [address] [place], [place] [address] o [phone] o Mon-Fri 7:00-5:00 o Babies seen by [facility] providers o Does NOT accept Medicaid . [facility] o Briscoe, MD; Howley, PA; Moreira, PA o [address], [place], [place] [address] o [phone] o Mon-Fri 8:00-5:00 o Babies seen by [facility] providers o Accepting Medicaid . [facility] o Boyd, MD; Church, PA; Jones, NP; Osborn, PA o [address], , [place] [address] o [phone] o Mon-Fri 8:00-5:00 o Babies seen by providers at [facility] o Accepting Medicaid  [place] ([address]) . [facility] [place] o Wendling, DO o [address] [place],    [address] o [phone] o Mon-Fri 8:00-5:00 o Babies seen by [facility] providers o Does NOT accept Medicaid o Limited availability, please call early in hospitalization to schedule follow-up . [facility] o Calderon, PA; Cummings, MD; Dillard, MD; Martin, PA; Olson, MD; VanDeven, PA o [address], [place], [place] [address] o [phone] o Mon-Fri 8:30-5:00, Sat 9:00-12:00 o Babies seen by providers at [facility] o Accepting Medicaid o Please register online then schedule online or call office o [email or web address] . [facility] ([facility] o Hunter, NP; Kumar, MD; Martin Rogers, PA o [address], [place], [place] [address] o [phone] o Mon-Fri 8:00-5:00 o Babies seen by providers at [facility] o Accepting Medicaid . [facility] ([facility] o [place], MD; Kristi Fleenor, NP; West, MD o [address], [place], [place] [address] o [phone] o Mon-Fri 8:00-5:30, Sat&Sun by appointment (phones open at 8:30) o Babies seen by [facility] providers o Accepting Medicaid o Must be a first-time baby or sibling of current patient . [facility] [place]  o [address], [place], [place]  [address] o [phone]   Fax - [phone]  [place] ([address]) . [place]  [facility] o Brown, PA; Cowen, PA; Rice, MD; Helton, PA; Spry, MD o [address] [place], [place] [address] o [phone] o Mon-Thur 8:00-7:00, Fri 8:00-5:00, Sat 8:00-12:00, Sun 9:00-12:00 o Babies seen by [facility] providers o Accepting Medicaid . [facility] o Coe-Goins, MD; Marshall, MD; Pierre-Louis, MD o [address] [place] [address], [place], [place] [address] o [phone] o Mon-Thur 8:00-5:00 o Babies seen by providers at [facility] o Accepting Medicaid . [facility] o Bratton, MD; Coe-Goins, MD; Hayes, MD; Lewis, MD; List, MD; Lott, MD; Marshall, MD; Moran, MD; O'Neal, MD; Pierre-Louis, MD; Pitonzo, MD; Scholer, MD; Spangle, MD o [address] [place], [place] [address] o [phone] o Mon-Fri 8:00-5:30, Sat (Oct.-Mar.) 9:00-1:00 o Babies seen by providers at [facility] o Accepting Medicaid o Must fill out new patient packet, available online at [email or web address] . [facility] ([facility] o Friddle, NP; Harris, NP; Kelly, NP; Logan, MD; Melvin, PA; Poth, MD; Ramadoss, MD; Stanton, NP o [address], [place], [place] [address] o [phone] o Mon-Thur 8:00-5:30, Fri 8:00-5:00 o Babies seen by providers at [facility] o Accepting Medicaid  [place] ([address]) . [facility] o Dixon, PA; , MD; Pickard, MD; Tapia, PA o [address], [place], [place] [address] o [phone] o Mon-Fri 8:00-5:00 o Babies seen by providers at [facility] o Accepting Medicaid   [place] ([address]) . [facility] [place] o Masneri, DO; Meyers, MD; Nelson, PA o [address], [place],  [address] o [phone] o Mon-Fri 8:00-5:00 o Babies seen by providers at [facility] o Does NOT accept Medicaid o Limited  appointment availability, please call early in hospitalization  . [facility] [place] o Kunedd, DO; McGowen, MD o [address], [place],  [address] o [phone] o Mon-Fri 8:00-5:00 o Babies seen by [facility] providers o Does NOT accept Medicaid . [facility] [place] o Cameron, MD; MacDonald,   MD; Michaels, PA; Nayak, MD o [address] [place] [address], [place], [place] [address] o [phone] o Mon-Fri 8:00-5:00 o After hours clinic ([address] [place], [place] [address]) [phone] Mon-Fri 5:00-8:00, Sat 12:00-6:00, Sun 10:00-4:00 o Babies seen by [facility] providers o Accepting Medicaid . [facility] [place] o [address], [place], [place]  [address] o [phone]   Fax - [phone]  [place] ([address]) . [facility] [place] [facility] o Andy, MD o [address], [place], [place] [address] o [phone] o Mon-Fri 8:00-5:00 o Babies seen by [facility] providers o Does NOT accept Medicaid . [facility] [place] ([facility] [place]) o Eksir, MD o [address], [place], [place] [address] o [phone] o Mon-Thur 8:00-7:00, Fri 8:00-5:00, Sat 8:00-12:00 o Babies seen by providers at [facility] o Accepting Medicaid - but does not have vaccinations in office (must be received elsewhere) o Limited availability, please call early in hospitalization  [place] ([address]) . [facility]  o Charlene Flemming, MD o [address],   [address] o [phone]  Fax [phone]   

## 2019-09-24 NOTE — Progress Notes (Signed)
Subjective:  Kathy Wilkerson is a 34 y.o. G3P0110 at [redacted]w[redacted]d being seen today for ongoing prenatal care.  She is currently monitored for the following issues for this high-risk pregnancy and has Previous preterm delivery, antepartum; Language barrier; History of incompetent cervix, currently pregnant; Supervision of high risk pregnancy, antepartum; Cervical cerclage suture present, antepartum; and Leukopenia on their problem list.  Patient reports no complaints.  Contractions: Not present. Vag. Bleeding: None.  Movement: Present. Denies leaking of fluid.   The following portions of the patient's history were reviewed and updated as appropriate: allergies, current medications, past family history, past medical history, past social history, past surgical history and problem list. Problem list updated.  Objective:   Vitals:   09/24/19 0839  Weight: 125 lb 14.4 oz (57.1 kg)    Fetal Status:     Movement: Present     General:  Alert, oriented and cooperative. Patient is in no acute distress.  Skin: Skin is warm and dry. No rash noted.   Cardiovascular: Normal heart rate noted  Respiratory: Normal respiratory effort, no problems with respiration noted  Abdomen: Soft, gravid, appropriate for gestational age. Pain/Pressure: Absent     Pelvic: Vag. Bleeding: None     Cervical exam deferred        Extremities: Normal range of motion.  Edema: None  Mental Status: Normal mood and affect. Normal behavior. Normal judgment and thought content.   Urinalysis:      Assessment and Plan:  Pregnancy: G3P0110 at [redacted]w[redacted]d  1. Supervision of high risk pregnancy, antepartum -Continue routine prenatal care -Birth control options discussed  2. History of incompetent cervix, currently pregnant -Continue Makena  3. Cervical cerclage suture present, antepartum -Stable  4. Previous preterm delivery, antepartum -On Makena -Cerclage in place  5. Language barrier -Arabic interpreter used today  6. Leukopenia,  unspecified type -No signs/symptoms of infection -Repeat CBC with 28 weeks labs  Preterm labor symptoms and general obstetric precautions including but not limited to vaginal bleeding, contractions, leaking of fluid and fetal movement were reviewed in detail with the patient. Please refer to After Visit Summary for other counseling recommendations.  Return in about 4 weeks (around 10/22/2019) for 28 week labs- in person [facility].   Kevontay Burks L, DO

## 2019-10-22 ENCOUNTER — Other Ambulatory Visit: Payer: Medicaid Other

## 2019-10-22 ENCOUNTER — Other Ambulatory Visit: Payer: Self-pay

## 2019-10-22 ENCOUNTER — Other Ambulatory Visit (HOSPITAL_COMMUNITY)
Admission: RE | Admit: 2019-10-22 | Discharge: 2019-10-22 | Disposition: A | Discharge status: Home / Self Care | Payer: Medicaid Other | Source: Ambulatory Visit | Attending: Family Medicine | Admitting: Family Medicine

## 2019-10-22 ENCOUNTER — Ambulatory Visit (INDEPENDENT_AMBULATORY_CARE_PROVIDER_SITE_OTHER): Payer: Medicaid Other | Admitting: Family Medicine

## 2019-10-22 VITALS — BP 113/71 | HR 76 | Wt 129.4 lb

## 2019-10-22 DIAGNOSIS — O0992 Supervision of high risk pregnancy, unspecified, second trimester: Secondary | ICD-10-CM

## 2019-10-22 DIAGNOSIS — O26892 Other specified pregnancy related conditions, second trimester: Secondary | ICD-10-CM | POA: Diagnosis not present

## 2019-10-22 DIAGNOSIS — Z23 Encounter for immunization: Secondary | ICD-10-CM | POA: Diagnosis not present

## 2019-10-22 DIAGNOSIS — O09299 Supervision of pregnancy with other poor reproductive or obstetric history, unspecified trimester: Secondary | ICD-10-CM

## 2019-10-22 DIAGNOSIS — O09212 Supervision of pregnancy with history of pre-term labor, second trimester: Secondary | ICD-10-CM

## 2019-10-22 DIAGNOSIS — O0990 Supervision of high risk pregnancy, unspecified, unspecified trimester: Secondary | ICD-10-CM

## 2019-10-22 DIAGNOSIS — Z8759 Personal history of other complications of pregnancy, childbirth and the puerperium: Secondary | ICD-10-CM

## 2019-10-22 DIAGNOSIS — N898 Other specified noninflammatory disorders of vagina: Secondary | ICD-10-CM

## 2019-10-22 DIAGNOSIS — Z789 Other specified health status: Secondary | ICD-10-CM

## 2019-10-22 DIAGNOSIS — Z3A27 27 weeks gestation of pregnancy: Secondary | ICD-10-CM

## 2019-10-22 DIAGNOSIS — O09219 Supervision of pregnancy with history of pre-term labor, unspecified trimester: Secondary | ICD-10-CM

## 2019-10-22 NOTE — Patient Instructions (Signed)

## 2019-10-22 NOTE — Progress Notes (Signed)
   PRENATAL VISIT NOTE  Subjective:  Kathy Wilkerson is a 34 y.o. G3P0110 at [redacted]w[redacted]d being seen today for ongoing prenatal care.  She is currently monitored for the following issues for this high-risk pregnancy and has Previous preterm delivery, antepartum; Language barrier; History of incompetent cervix, currently pregnant; Supervision of high risk pregnancy, antepartum; Cervical cerclage suture present, antepartum; and Leukopenia on their problem list.  Patient reports vaginal discharge and knots following Makena injection.  Contractions: Not present. Vag. Bleeding: None.  Movement: Present. Denies leaking of fluid.   The following portions of the patient's history were reviewed and updated as appropriate: allergies, current medications, past family history, past medical history, past social history, past surgical history and problem list.   Objective:   Vitals:   10/22/19 0942  BP: 113/71  Pulse: 76  Weight: 129 lb 6.4 oz (58.7 kg)    Fetal Status: Fetal Heart Rate (bpm): 142 Fundal Height: 28 cm Movement: Present     General:  Alert, oriented and cooperative. Patient is in no acute distress.  Skin: Skin is warm and dry. No rash noted.   Cardiovascular: Normal heart rate noted  Respiratory: Normal respiratory effort, no problems with respiration noted  Abdomen: Soft, gravid, appropriate for gestational age.  Pain/Pressure: Absent     Pelvic: Cervical exam deferred        Extremities: Normal range of motion.  Edema: None  Mental Status: Normal mood and affect. Normal behavior. Normal judgment and thought content.   Assessment and Plan:  Pregnancy: G3P0110 at [redacted]w[redacted]d 1. Supervision of high risk pregnancy, antepartum Ate at 4 am 28 wk labs next visit--come fasting - Tdap vaccine greater than or equal to 7yo IM  2. Previous preterm delivery, antepartum Continue Makena  3. History of incompetent cervix, currently pregnant Has cerclage in place  4. Language barrier Arabic  interpreter: Maralyn Sago used   5. Vaginal discharge during pregnancy in second trimester Check and treat as needed. - Cervicovaginal ancillary only( [place])  Preterm labor symptoms and general obstetric precautions including but not limited to vaginal bleeding, contractions, leaking of fluid and fetal movement were reviewed in detail with the patient. Please refer to After Visit Summary for other counseling recommendations.   Return in 2 weeks (on 11/05/2019) for 28 wk labs, HRC.  Future Appointments  Date Time Provider Department Center  11/12/2019  8:35 AM Reva Bores, MD [facility]  11/12/2019 10:00 AM [facility]    Reva Bores, MD

## 2019-10-23 LAB — CERVICOVAGINAL ANCILLARY ONLY
Bacterial Vaginitis (gardnerella): NEGATIVE
Candida Glabrata: NEGATIVE
Candida Vaginitis: NEGATIVE
Comment: NEGATIVE
Comment: NEGATIVE
Comment: NEGATIVE

## 2019-10-24 ENCOUNTER — Observation Stay (HOSPITAL_BASED_OUTPATIENT_CLINIC_OR_DEPARTMENT_OTHER): Payer: Medicaid Other

## 2019-10-24 ENCOUNTER — Observation Stay (HOSPITAL_COMMUNITY)
Admission: AD | Admit: 2019-10-24 | Discharge: 2019-10-25 | Disposition: A | Discharge status: Home / Self Care | Payer: Medicaid Other | Attending: Obstetrics and Gynecology | Admitting: Obstetrics and Gynecology

## 2019-10-24 ENCOUNTER — Other Ambulatory Visit: Payer: Self-pay

## 2019-10-24 ENCOUNTER — Encounter (HOSPITAL_COMMUNITY): Payer: Self-pay | Admitting: Obstetrics and Gynecology

## 2019-10-24 DIAGNOSIS — O208 Other hemorrhage in early pregnancy: Principal | ICD-10-CM | POA: Insufficient documentation

## 2019-10-24 DIAGNOSIS — O3432 Maternal care for cervical incompetence, second trimester: Secondary | ICD-10-CM

## 2019-10-24 DIAGNOSIS — Z3A27 27 weeks gestation of pregnancy: Secondary | ICD-10-CM | POA: Insufficient documentation

## 2019-10-24 DIAGNOSIS — O4692 Antepartum hemorrhage, unspecified, second trimester: Secondary | ICD-10-CM | POA: Diagnosis not present

## 2019-10-24 DIAGNOSIS — O09292 Supervision of pregnancy with other poor reproductive or obstetric history, second trimester: Secondary | ICD-10-CM

## 2019-10-24 DIAGNOSIS — Z20822 Contact with and (suspected) exposure to covid-19: Secondary | ICD-10-CM | POA: Insufficient documentation

## 2019-10-24 DIAGNOSIS — Z3A37 37 weeks gestation of pregnancy: Secondary | ICD-10-CM

## 2019-10-24 LAB — CBC
HCT: 32.2 % — ABNORMAL LOW (ref 36.0–46.0)
Hemoglobin: 10.8 g/dL — ABNORMAL LOW (ref 12.0–15.0)
MCH: 32.3 pg (ref 26.0–34.0)
MCHC: 33.5 g/dL (ref 30.0–36.0)
MCV: 96.4 fL (ref 80.0–100.0)
Platelets: 196 10*3/uL (ref 150–400)
RBC: 3.34 MIL/uL — ABNORMAL LOW (ref 3.87–5.11)
RDW: 13.4 % (ref 11.5–15.5)
WBC: 4.1 10*3/uL (ref 4.0–10.5)
nRBC: 0 % (ref 0.0–0.2)

## 2019-10-24 LAB — SARS CORONAVIRUS 2 (TAT 6-24 HRS): SARS Coronavirus 2: NEGATIVE

## 2019-10-24 LAB — TYPE AND SCREEN
ABO/RH(D): A POS
Antibody Screen: NEGATIVE

## 2019-10-24 MED ORDER — ACETAMINOPHEN 325 MG PO TABS: 650.0000 mg | ORAL_TABLET | ORAL | Status: DC | PRN

## 2019-10-24 MED ORDER — PRENATAL MULTIVITAMIN CH
1.0000 | ORAL_TABLET | Freq: Every day | ORAL | Status: DC
  Administered 2019-10-24: 1 via ORAL
  Filled 2019-10-24: qty 1

## 2019-10-24 MED ORDER — LACTATED RINGERS IV SOLN: INTRAVENOUS | Status: DC

## 2019-10-24 MED ORDER — BETAMETHASONE SOD PHOS & ACET 6 (3-3) MG/ML IJ SUSP
12.0000 mg | INTRAMUSCULAR | Status: AC
  Administered 2019-10-24 – 2019-10-25 (×2): 12 mg via INTRAMUSCULAR
  Filled 2019-10-24: qty 5

## 2019-10-24 MED ORDER — CALCIUM CARBONATE ANTACID 500 MG PO CHEW: 2.0000 | CHEWABLE_TABLET | ORAL | Status: DC | PRN

## 2019-10-24 MED ORDER — ZOLPIDEM TARTRATE 5 MG PO TABS: 5.0000 mg | ORAL_TABLET | Freq: Every evening | ORAL | Status: DC | PRN

## 2019-10-24 MED ORDER — DOCUSATE SODIUM 100 MG PO CAPS
100.0000 mg | ORAL_CAPSULE | Freq: Every day | ORAL | Status: DC
  Administered 2019-10-24: 100 mg via ORAL
  Filled 2019-10-24: qty 1

## 2019-10-24 NOTE — MAU Provider Note (Addendum)
Chief Complaint:  Vaginal Bleeding   First Provider Initiated Contact with Patient 10/24/19 0140     HPI: Kathy Wilkerson is a 33 y.o. G3P0110 at 42w5dwho presents to maternity admissions reporting pelvic pain all day and small amount of blood on tissue tonight after midnight. . She reports good fetal movement, denies LOF, vaginal bleeding, vaginal itching/burning, urinary symptoms, h/a, dizziness, n/v, diarrhea, constipation or fever/chills.  She denies headache, visual changes or RUQ abdominal pain.  Had a cerclage placed in January.   Vaginal Bleeding The patient's primary symptoms include pelvic pain and vaginal bleeding. The patient's pertinent negatives include no genital itching, genital lesions or genital odor. This is a new problem. The current episode started today. The problem occurs intermittently. The problem has been unchanged. The pain is mild. Associated symptoms include abdominal pain. Pertinent negatives include no chills, constipation, diarrhea, fever, frequency, headaches, nausea or vomiting. The vaginal discharge was bloody. The vaginal bleeding is spotting. She has not been passing clots. She has not been passing tissue. Nothing aggravates the symptoms. She has tried nothing for the symptoms.    RN Note: Having abdominal pain all day. About 0030 wiped and saw red blood but not a lot. Good FM. Pain is crampy and comes and goes.  Past Medical History: Past Medical History:  Diagnosis Date  . Medical history non-contributory   . Preterm labor     Past obstetric history: OB History  Gravida Para Term Preterm AB Living  3 1   1 1  0  SAB TAB Ectopic Multiple Live Births  1       1    # Outcome Date GA Lbr Len/2nd Weight Sex Delivery Anes PTL Lv  3 Current           2 SAB 10/06/18 [redacted]w[redacted]d   F  None  FD     Birth Comments: Placenta delivered around 1030, large retroplacental clots, EBL 800 ml     Complications: Other Excessive Bleeding  1 Preterm 11/05/17 [redacted]w[redacted]d  2000 g F  Vag-Spont  N ND     Birth Comments: bleeding    Past Surgical History: Past Surgical History:  Procedure Laterality Date  . CERVICAL CERCLAGE N/A 07/10/2019   Procedure: CERCLAGE CERVICAL;  Surgeon: 07/12/2019, MD;  Location: [facility];  Service: Gynecology;  Laterality: N/A;    Family History: Family History  Problem Relation Age of Onset  . Healthy Mother   . Diabetes Father   . Heart disease Father   . Hypertension Father     Social History: Social History   Tobacco Use  . Smoking status: Never Smoker  . Smokeless tobacco: Never Used  Substance Use Topics  . Alcohol use: Never  . Drug use: Never    Allergies: No Known Allergies  Meds:  Medications Prior to Admission  Medication Sig Dispense Refill Last Dose  . HYDROXYprogesterone caproate (MAKENA) autoinjector Inject 275 mg into the skin every 7 (seven) days.   Past Week at Unknown time  . prenatal vitamin w/FE, FA (PRENATAL 1 + 1) 27-1 MG TABS tablet Take 1 tablet by mouth daily at 12 noon. 30 tablet 11 10/24/2019 at Unknown time    I have reviewed patient's Past Medical Hx, Surgical Hx, Family Hx, Social Hx, medications and allergies.   ROS:  Review of Systems  Constitutional: Negative for chills and fever.  Gastrointestinal: Positive for abdominal pain. Negative for constipation, diarrhea, nausea and vomiting.  Genitourinary: Positive for pelvic pain and  vaginal bleeding. Negative for frequency.  Neurological: Negative for headaches.   Other systems negative  Physical Exam   Patient Vitals for the past 24 hrs:  BP Temp Pulse Resp Height Weight  10/24/19 0105 120/72 -- 96 -- -- --  10/24/19 0104 -- 98.3 F (36.8 C) -- 16 5\' 3"  (1.6 m) 58.5 kg   Constitutional: Well-developed, well-nourished female in no acute distress.  Cardiovascular: normal rate and rhythm Respiratory: normal effort, clear to auscultation bilaterally GI: Abd soft, non-tender, gravid appropriate for gestational age.   No rebound  or guarding. MS: Extremities nontender, no edema, normal ROM Neurologic: Alert and oriented x 4.  GU: Neg CVAT.  PELVIC EXAM: Cervix pink, visually closed, moderate amount of dark red discharge, Stitch intact Bimanual exam: Cervix 1cm   FHT:  Baseline 140 , moderate variability, accelerations present, no decelerations Contractions: uterine irritability   Labs: Results for orders placed or performed during the hospital encounter of 10/24/19 (from the past 24 hour(s))  Type and screen [place]     Status: None   Collection Time: 10/24/19  2:49 AM  Result Value Ref Range   ABO/RH(D) A POS    Antibody Screen NEG    Sample Expiration      10/27/2019,2359 Performed at [facility], [place] [address], [place], [place] [address]   CBC on admission     Status: Abnormal   Collection Time: 10/24/19  2:49 AM  Result Value Ref Range   WBC 4.1 4.0 - 10.5 K/uL   RBC 3.34 (L) 3.87 - 5.11 MIL/uL   Hemoglobin 10.8 (L) 12.0 - 15.0 g/dL   HCT 32.2 (L) 36.0 - 46.0 %   MCV 96.4 80.0 - 100.0 fL   MCH 32.3 26.0 - 34.0 pg   MCHC 33.5 30.0 - 36.0 g/dL   RDW 13.4 11.5 - 15.5 %   Platelets 196 150 - 400 K/uL   nRBC 0.0 0.0 - 0.2 %  SARS CORONAVIRUS 2 (TAT 6-24 HRS) Nasopharyngeal Nasopharyngeal Swab     Status: None   Collection Time: 10/24/19  2:54 AM   Specimen: Nasopharyngeal Swab  Result Value Ref Range   SARS Coronavirus 2 NEGATIVE NEGATIVE   A/Positive/-- (01/06 1216)  Imaging:  Anterior placenta No evidence of abruption Cervix 3.29cm long  MAU Course/MDM: I have ordered labs and reviewed results.   FFn not done due to bleeding and cerclage NST reviewed, reassuring for gestational age. Uterine irritability noted.  IV bolus given with resolution Consult Dr Vevelyn Francois with presentation, exam findings and test results.  Recommends admission.  Neonatologist consulted and approves admission  Treatments in MAU included Betamethasone series.  Considered MgSO4  neuroprophylaxis, will leave to MD to decide.  Assessment: Single IUP at [redacted]w[redacted]d Cerclage in place Bleeding in second trimester Uterine irritability improved with IV fluids  Plan: Admit to Antenatal  Betamethasone series MD to follow   Hansel Feinstein CNM, MSN Certified Nurse-Midwife 10/24/2019 1:40 AM

## 2019-10-24 NOTE — MAU Note (Signed)
Having abdominal pain all day. About 0030 wiped and saw red blood but not a lot. Good FM. Pain is crampy and comes and goes.

## 2019-10-24 NOTE — H&P (Signed)
Chief Complaint:  Vaginal Bleeding   First Provider Initiated Contact with Patient 10/24/19 0140     HPI: Kathy Wilkerson is a 33 y.o. G3P0110 at 42w5dwho presents to maternity admissions reporting pelvic pain all day and small amount of blood on tissue tonight after midnight. . She reports good fetal movement, denies LOF, vaginal bleeding, vaginal itching/burning, urinary symptoms, h/a, dizziness, n/v, diarrhea, constipation or fever/chills.  She denies headache, visual changes or RUQ abdominal pain.  Had a cerclage placed in January.   Vaginal Bleeding The patient's primary symptoms include pelvic pain and vaginal bleeding. The patient's pertinent negatives include no genital itching, genital lesions or genital odor. This is a new problem. The current episode started today. The problem occurs intermittently. The problem has been unchanged. The pain is mild. Associated symptoms include abdominal pain. Pertinent negatives include no chills, constipation, diarrhea, fever, frequency, headaches, nausea or vomiting. The vaginal discharge was bloody. The vaginal bleeding is spotting. She has not been passing clots. She has not been passing tissue. Nothing aggravates the symptoms. She has tried nothing for the symptoms.    RN Note: Having abdominal pain all day. About 0030 wiped and saw red blood but not a lot. Good FM. Pain is crampy and comes and goes.  Past Medical History: Past Medical History:  Diagnosis Date  . Medical history non-contributory   . Preterm labor     Past obstetric history: OB History  Gravida Para Term Preterm AB Living  3 1   1 1  0  SAB TAB Ectopic Multiple Live Births  1       1    # Outcome Date GA Lbr Len/2nd Weight Sex Delivery Anes PTL Lv  3 Current           2 SAB 10/06/18 [redacted]w[redacted]d   F  None  FD     Birth Comments: Placenta delivered around 1030, large retroplacental clots, EBL 800 ml     Complications: Other Excessive Bleeding  1 Preterm 11/05/17 [redacted]w[redacted]d  2000 g F  Vag-Spont  N ND     Birth Comments: bleeding    Past Surgical History: Past Surgical History:  Procedure Laterality Date  . CERVICAL CERCLAGE N/A 07/10/2019   Procedure: CERCLAGE CERVICAL;  Surgeon: 07/12/2019, MD;  Location: [facility];  Service: Gynecology;  Laterality: N/A;    Family History: Family History  Problem Relation Age of Onset  . Healthy Mother   . Diabetes Father   . Heart disease Father   . Hypertension Father     Social History: Social History   Tobacco Use  . Smoking status: Never Smoker  . Smokeless tobacco: Never Used  Substance Use Topics  . Alcohol use: Never  . Drug use: Never    Allergies: No Known Allergies  Meds:  Medications Prior to Admission  Medication Sig Dispense Refill Last Dose  . HYDROXYprogesterone caproate (MAKENA) autoinjector Inject 275 mg into the skin every 7 (seven) days.   Past Week at Unknown time  . prenatal vitamin w/FE, FA (PRENATAL 1 + 1) 27-1 MG TABS tablet Take 1 tablet by mouth daily at 12 noon. 30 tablet 11 10/24/2019 at Unknown time    I have reviewed patient's Past Medical Hx, Surgical Hx, Family Hx, Social Hx, medications and allergies.   ROS:  Review of Systems  Constitutional: Negative for chills and fever.  Gastrointestinal: Positive for abdominal pain. Negative for constipation, diarrhea, nausea and vomiting.  Genitourinary: Positive for pelvic pain and  vaginal bleeding. Negative for frequency.  Neurological: Negative for headaches.   Other systems negative  Physical Exam   Patient Vitals for the past 24 hrs:  BP Temp Pulse Resp Height Weight  10/24/19 0105 120/72 -- 96 -- -- --  10/24/19 0104 -- 98.3 F (36.8 C) -- 16 5' 3" (1.6 m) 58.5 kg   Constitutional: Well-developed, well-nourished female in no acute distress.  Cardiovascular: normal rate and rhythm Respiratory: normal effort, clear to auscultation bilaterally GI: Abd soft, non-tender, gravid appropriate for gestational age.   No rebound  or guarding. MS: Extremities nontender, no edema, normal ROM Neurologic: Alert and oriented x 4.  GU: Neg CVAT.  PELVIC EXAM: Cervix pink, visually closed, moderate amount of dark red discharge, Stitch intact Bimanual exam: Cervix 1cm   FHT:  Baseline 140 , moderate variability, accelerations present, no decelerations Contractions: uterine irritability   Labs: Results for orders placed or performed during the hospital encounter of 10/24/19 (from the past 24 hour(s))  Type and screen [facility]     Status: None   Collection Time: 10/24/19  2:49 AM  Result Value Ref Range   ABO/RH(D) A POS    Antibody Screen NEG    Sample Expiration      10/27/2019,2359 Performed at  Hospital Lab, [address] [place], [place] [address]   CBC on admission     Status: Abnormal   Collection Time: 10/24/19  2:49 AM  Result Value Ref Range   WBC 4.1 4.0 - 10.5 K/uL   RBC 3.34 (L) 3.87 - 5.11 MIL/uL   Hemoglobin 10.8 (L) 12.0 - 15.0 g/dL   HCT 32.2 (L) 36.0 - 46.0 %   MCV 96.4 80.0 - 100.0 fL   MCH 32.3 26.0 - 34.0 pg   MCHC 33.5 30.0 - 36.0 g/dL   RDW 13.4 11.5 - 15.5 %   Platelets 196 150 - 400 K/uL   nRBC 0.0 0.0 - 0.2 %  SARS CORONAVIRUS 2 (TAT 6-24 HRS) Nasopharyngeal Nasopharyngeal Swab     Status: None   Collection Time: 10/24/19  2:54 AM   Specimen: Nasopharyngeal Swab  Result Value Ref Range   SARS Coronavirus 2 NEGATIVE NEGATIVE   A/Positive/-- (01/06 1216)  Imaging:  Anterior placenta No evidence of abruption Cervix 3.29cm long  MAU Course/MDM: I have ordered labs and reviewed results.   FFn not done due to bleeding and cerclage NST reviewed, reassuring for gestational age. Uterine irritability noted.  IV bolus given with resolution Consult Dr Pezzuto with presentation, exam findings and test results.  Recommends admission.  Neonatologist consulted and approves admission  Treatments in MAU included Betamethasone series.  Considered MgSO4  neuroprophylaxis, will leave to MD to decide.  Assessment: Single IUP at [redacted]w[redacted]d Cerclage in place Bleeding in second trimester Uterine irritability improved with IV fluids  Plan: Admit to Antenatal  Betamethasone series MD to follow   Milano Rosevear CNM, MSN Certified Nurse-Midwife 10/24/2019 1:40 AM 

## 2019-10-24 NOTE — Progress Notes (Signed)
Pt to u/s via w/c and then to 106 from u/s.

## 2019-10-24 NOTE — Plan of Care (Signed)
  Problem: Education: Goal: Knowledge of disease or condition will improve Outcome: Progressing   Problem: Education: Goal: Knowledge of General Education information will improve Description: Including pain rating scale, medication(s)/side effects and non-pharmacologic comfort measures Outcome: Progressing   Problem: Health Behavior/Discharge Planning: Goal: Ability to manage health-related needs will improve Outcome: Progressing   Problem: Clinical Measurements: Goal: Ability to maintain clinical measurements within normal limits will improve Outcome: Progressing Goal: Will remain free from infection Outcome: Progressing

## 2019-10-24 NOTE — Progress Notes (Signed)
Patient ID: Kathy Wilkerson, female   DOB: [birth year], 34 y.o.   MRN: [id number] [facility] ANTEPARTUM COMPREHENSIVE PROGRESS NOTE  Kathy Wilkerson is a 34 y.o. G3P0110 at [redacted]w[redacted]d  who is admitted for vaginal bleeding, second trimester in setting of cerclage.   Fetal presentation is unsure. Length of Stay:  0  Days  Subjective: Pt reports no bleeding with last use of restroom. She denies LOF, ut ctx, pelvic pain, cramps or pressure Patient reports good fetal movement.    Vitals:  Blood pressure 108/68, pulse 79, temperature 97.8 F (36.6 C), temperature source Oral, resp. rate 16, height 5\' 3"  (1.6 m), weight 58.5 kg, last menstrual period 04/13/2019, SpO2 100 %.   Physical Examination: Lungs clear Heart RRR Abd soft + BS gravid non tender Ext non tender  Fetal Monitoring:  140's 10 x 10 accels, no ut ctx  Labs:  Results for orders placed or performed during the hospital encounter of 10/24/19 (from the past 24 hour(s))  CBC on admission   Collection Time: 10/24/19  2:49 AM  Result Value Ref Range   WBC 4.1 4.0 - 10.5 K/uL   RBC 3.34 (L) 3.87 - 5.11 MIL/uL   Hemoglobin 10.8 (L) 12.0 - 15.0 g/dL   HCT 12/24/19 (L) 66.0 - 63.0 %   MCV 96.4 80.0 - 100.0 fL   MCH 32.3 26.0 - 34.0 pg   MCHC 33.5 30.0 - 36.0 g/dL   RDW 16.0 10.9 - 32.3 %   Platelets 196 150 - 400 K/uL   nRBC 0.0 0.0 - 0.2 %  Type and screen [facility]   Collection Time: 10/24/19  2:49 AM  Result Value Ref Range   ABO/RH(D) A POS    Antibody Screen NEG    Sample Expiration      10/27/2019,2359 Performed at [facility], [address], [place], [place]   SARS CORONAVIRUS 2 (TAT 6-24 HRS) Nasopharyngeal Nasopharyngeal Swab   Collection Time: 10/24/19  2:54 AM   Specimen: Nasopharyngeal Swab  Result Value Ref Range   SARS Coronavirus 2 NEGATIVE NEGATIVE    Imaging Studies:    U/S completed   Medications:  Scheduled . betamethasone acetate-betamethasone sodium phosphate  12 mg  Intramuscular Q24H  . docusate sodium  100 mg Oral Daily  . prenatal multivitamin  1 tablet Oral Q1200   I have reviewed the patient's current medications.  ASSESSMENT: IUP 27 5/7 weeks Second trimester vaginal bleeding H/O PTD, weekly [place], Tuesday Incompetent cervix, cerclage in place Language barrier  PLAN: Stable. No further vaginal bleeding. BMZ. Fetal status reassuring.  Continue routine antenatal care.   Sunday 10/24/2019,1:26 PM

## 2019-10-24 NOTE — MAU Note (Signed)
Using [employer] # [id number]. Pt would like her spouse to interpret. Waiver signed

## 2019-10-24 NOTE — Progress Notes (Signed)
Pt and pt's husband requested to speak with RN about wanting to go home. Pt's husband stated if there is no concerns or medical problem regarding the bleeding, they rather be home so she can be more comfortable. RN discussed with pt and family the plan of care and importance of observation after a vag bleed. Also, discussed pt rights. RN offered pt and family to speak with provider about concerns and plan of care. Pt and pt's husband stated they will stay through the night and speak with the provider in the am during rounds about their concerns and decide what is best for them from there. Pt and family verbally agreed for RN to continue providing care without refusal.

## 2019-10-25 NOTE — Discharge Instructions (Signed)
Vaginal Bleeding During Pregnancy, Third Trimester ° °A small amount of bleeding (spotting) from the vagina is common during pregnancy. Sometimes the bleeding is normal and is not a problem, and sometimes it is a sign of something serious. Tell your doctor about any bleeding from your vagina right away. °Follow these instructions at home: °Activity °· Follow your doctor's instructions about how active you can be. Your doctor may recommend that you: °? Stay in bed and only get up to use the bathroom. °? Continue light activity. °· If needed, make plans for someone to help you with your normal activities. °· Ask your doctor if it is safe for you to drive. °· Do not lift anything that is heavier than 10 lb (4.5 kg) until your doctor says that this is safe. °· Do not have sex or orgasms until your doctor says that this is safe. °Medicines °· Take over-the-counter and prescription medicines only as told by your doctor. °· Do not take aspirin. It can cause bleeding. °General instructions °· Watch your condition for any changes. °· Write down: °? The number of pads you use each day. °? How often you change pads. °? How soaked (saturated) your pads are. °· Do not use tampons. °· Do not douche. °· If you pass any tissue from your vagina, save the tissue to show your doctor. °· Keep all follow-up visits as told by your doctor. This is important. °Contact a doctor if: °· You have vaginal bleeding at any time during pregnancy. °· You have cramps. °· You have a fever. °Get help right away if: °· You have very bad cramps. °· You have very bad pain in your back or belly (abdomen). °· You have a gush of fluid from your vagina. °· You pass large clots or a lot of tissue from your vagina. °· Your bleeding gets worse. °· You feel light-headed or weak. °· You pass out (faint). °· Your baby is moving less than usual, or not moving at all. °Summary °· Tell your doctor about any bleeding from your vagina right away. °· Follow instructions  from your doctor about how active you can be. You may need someone to help you with your normal activities. °This information is not intended to replace advice given to you by your health care provider. Make sure you discuss any questions you have with your health care provider. °Document Revised: 09/24/2018 Document Reviewed: 09/06/2016 °Elsevier Patient Education © 2020 [employer] ° °

## 2019-10-25 NOTE — Discharge Summary (Signed)
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 G3P0110 who presented at 232w5dith complaint of cramping and vaginal bleeding.  On speculum exam she was noted to have an intact cerclage not under strain and a small amount of dark blood coming from the uterus.  She had normal fetal heart rate monitoring for gestational age and no signs or symptoms of labor.  Her ultrasound was normal.  She received a course of betamethasone and was observed until she was over 24 hours without any further spotting.  She had no pain or labor symptoms and strongly desired discharge home.  She is Arabic speaking and interpreter line was used for all clinical consultations.  Magnesium Sulfate received: No BMZ received: Yes Rhophylac:No MMR:No Transfusion:No  Physical exam  Vitals:   10/24/19 1950 10/24/19 2305 10/25/19 0544 10/25/19 0847  BP:  (!) 102/57 (!) 98/48 106/60  Pulse:  74 78 82  Resp:  '18 18 17  '$ Temp:  98.3 F (36.8 C) 97.6 F (36.4 C) 98.2 F (36.8 C)  TempSrc:  Oral Oral Oral  SpO2: 98% 96% 97% 98%  Weight:      Height:       General: alert, cooperative, and no distress Bleeding resolved Uterine Fundus: soft, nontender, appropriate to gestational age FHR: 135s, moderate variability, accelerations present, decelerations absent Toco: quiet DVT Evaluation: No evidence of DVT seen on physical exam. SCDs  on. Labs: Lab Results  Component Value Date   WBC 4.1 10/24/2019   HGB 10.8 (L) 10/24/2019   HCT 32.2 (L) 10/24/2019   MCV 96.4 10/24/2019   PLT 196 10/24/2019   No flowsheet data found. Edinburgh Score: No flowsheet data found.  Discharge instruction: per After Visit Summary and "Baby and Me Booklet".  After visit meds:  Allergies as of 10/25/2019   No Known Allergies     Medication List    TAKE these medications   HYDROXYprogesterone caproate autoinjector Commonly known as: Makena Inject 275 mg into the skin every 7 (seven) days.   prenatal vitamin w/FE, FA 27-1 MG Tabs tablet Take 1 tablet by mouth daily at 12 noon.       Diet: routine diet  Activity: Advance as tolerated. Pelvic rest for 6 weeks.   Outpatient follow up: keep next appointments as scheduled Follow up Appt: Future Appointments  Date Time Provider [place]5/26/2021  8:35 AM [facility]namae [facility]5/26/2021 10:00 AM [facility]  Follow up Visit: Precautions were given to return for bleeding, leakage of fluid, decreased fetal movements or contractions.  Disposition: home   10/25/2019 Debbrah Alar, MD

## 2019-10-25 NOTE — Progress Notes (Signed)
Discharge instructions given to pt w/ utilization of the interpreter. Discussed signs and symptoms to report to the MD, upcoming appointments, and meds. Pt verbalizes understanding and has no questions or concerns at this time. Pt discharged in stable condition.

## 2019-11-10 ENCOUNTER — Other Ambulatory Visit: Payer: Self-pay | Admitting: *Deleted

## 2019-11-10 DIAGNOSIS — O0990 Supervision of high risk pregnancy, unspecified, unspecified trimester: Secondary | ICD-10-CM

## 2019-11-10 DIAGNOSIS — O09219 Supervision of pregnancy with history of pre-term labor, unspecified trimester: Secondary | ICD-10-CM

## 2019-11-12 ENCOUNTER — Other Ambulatory Visit: Payer: Self-pay

## 2019-11-12 ENCOUNTER — Encounter: Payer: Self-pay | Admitting: Family Medicine

## 2019-11-12 ENCOUNTER — Other Ambulatory Visit: Payer: Medicaid Other

## 2019-11-12 ENCOUNTER — Ambulatory Visit (INDEPENDENT_AMBULATORY_CARE_PROVIDER_SITE_OTHER): Payer: Medicaid Other | Admitting: Family Medicine

## 2019-11-12 VITALS — BP 113/40 | HR 89 | Wt 130.2 lb

## 2019-11-12 DIAGNOSIS — Z789 Other specified health status: Secondary | ICD-10-CM

## 2019-11-12 DIAGNOSIS — O09213 Supervision of pregnancy with history of pre-term labor, third trimester: Secondary | ICD-10-CM

## 2019-11-12 DIAGNOSIS — O09219 Supervision of pregnancy with history of pre-term labor, unspecified trimester: Secondary | ICD-10-CM

## 2019-11-12 DIAGNOSIS — O0990 Supervision of high risk pregnancy, unspecified, unspecified trimester: Secondary | ICD-10-CM

## 2019-11-12 DIAGNOSIS — Z3A30 30 weeks gestation of pregnancy: Secondary | ICD-10-CM

## 2019-11-12 DIAGNOSIS — O3430 Maternal care for cervical incompetence, unspecified trimester: Secondary | ICD-10-CM

## 2019-11-12 DIAGNOSIS — O3433 Maternal care for cervical incompetence, third trimester: Secondary | ICD-10-CM

## 2019-11-12 NOTE — Patient Instructions (Signed)

## 2019-11-12 NOTE — Progress Notes (Signed)
   PRENATAL VISIT NOTE  Subjective:  Tuleen Mandelbaum is a 34 y.o. G3P0110 at [redacted]w[redacted]d being seen today for ongoing prenatal care.  She is currently monitored for the following issues for this high-risk pregnancy and has Previous preterm delivery, antepartum; Language barrier; History of incompetent cervix, currently pregnant; Supervision of high risk pregnancy, antepartum; Cervical cerclage suture present, antepartum; Leukopenia; and Vaginal bleeding in pregnancy, second trimester on their problem list.  Patient reports itching at site of Makena injections.  Contractions: Not present. Vag. Bleeding: None.  Movement: Present. Denies leaking of fluid.   The following portions of the patient's history were reviewed and updated as appropriate: allergies, current medications, past family history, past medical history, past social history, past surgical history and problem list.   Objective:   Vitals:   11/12/19 0848  BP: (!) 113/40  Pulse: 89  Weight: 130 lb 3.2 oz (59.1 kg)    Fetal Status: Fetal Heart Rate (bpm): 147 Fundal Height: 28 cm Movement: Present     General:  Alert, oriented and cooperative. Patient is in no acute distress.  Skin: Skin is warm and dry. No rash noted.   Cardiovascular: Normal heart rate noted  Respiratory: Normal respiratory effort, no problems with respiration noted  Abdomen: Soft, gravid, appropriate for gestational age.  Pain/Pressure: Absent     Pelvic: Cervical exam deferred        Extremities: Normal range of motion.  Edema: None  Mental Status: Normal mood and affect. Normal behavior. Normal judgment and thought content.   Assessment and Plan:  Pregnancy: G3P0110 at [redacted]w[redacted]d 1. Cervical cerclage suture present, antepartum Continue Cerclage  2. Language barrier Arabic interpreter: live used   3. Supervision of high risk pregnancy, antepartum 28 wk labs today--had TDaP last visit  4. Previous preterm delivery, antepartum On 17 P--continue, trial of  anti-histamine prior to dose.  Preterm labor symptoms and general obstetric precautions including but not limited to vaginal bleeding, contractions, leaking of fluid and fetal movement were reviewed in detail with the patient. Please refer to After Visit Summary for other counseling recommendations.   Return in 2 weeks (on 11/26/2019) for in person due to langauge barrier, HRC.  Future Appointments  Date Time Provider Department Center  11/12/2019 10:00 AM [facility]    Reva Bores, MD

## 2019-11-13 LAB — CBC
Hematocrit: 32.9 % — ABNORMAL LOW (ref 34.0–46.6)
Hemoglobin: 11.5 g/dL (ref 11.1–15.9)
MCH: 33.4 pg — ABNORMAL HIGH (ref 26.6–33.0)
MCHC: 35 g/dL (ref 31.5–35.7)
MCV: 96 fL (ref 79–97)
Platelets: 159 10*3/uL (ref 150–450)
RBC: 3.44 x10E6/uL — ABNORMAL LOW (ref 3.77–5.28)
RDW: 12.9 % (ref 11.7–15.4)
WBC: 4.3 10*3/uL (ref 3.4–10.8)

## 2019-11-13 LAB — GLUCOSE TOLERANCE, 2 HOURS W/ 1HR
Glucose, 1 hour: 146 mg/dL (ref 65–179)
Glucose, 2 hour: 101 mg/dL (ref 65–152)
Glucose, Fasting: 76 mg/dL (ref 65–91)

## 2019-11-13 LAB — HIV ANTIBODY (ROUTINE TESTING W REFLEX): HIV Screen 4th Generation wRfx: NONREACTIVE

## 2019-11-13 LAB — RPR: RPR Ser Ql: NONREACTIVE

## 2019-11-25 ENCOUNTER — Encounter (HOSPITAL_COMMUNITY): Payer: Self-pay | Admitting: Family Medicine

## 2019-11-25 ENCOUNTER — Inpatient Hospital Stay (HOSPITAL_COMMUNITY)
Admission: AD | Admit: 2019-11-25 | Discharge: 2019-12-01 | DRG: 831 | Disposition: A | Discharge status: Home / Self Care | Payer: Medicaid Other | Attending: Obstetrics and Gynecology | Admitting: Obstetrics and Gynecology

## 2019-11-25 ENCOUNTER — Other Ambulatory Visit: Payer: Self-pay

## 2019-11-25 ENCOUNTER — Observation Stay (HOSPITAL_BASED_OUTPATIENT_CLINIC_OR_DEPARTMENT_OTHER): Payer: Medicaid Other

## 2019-11-25 DIAGNOSIS — O3433 Maternal care for cervical incompetence, third trimester: Secondary | ICD-10-CM | POA: Diagnosis not present

## 2019-11-25 DIAGNOSIS — Z3A32 32 weeks gestation of pregnancy: Secondary | ICD-10-CM

## 2019-11-25 DIAGNOSIS — O4693 Antepartum hemorrhage, unspecified, third trimester: Secondary | ICD-10-CM

## 2019-11-25 DIAGNOSIS — N939 Abnormal uterine and vaginal bleeding, unspecified: Secondary | ICD-10-CM | POA: Diagnosis present

## 2019-11-25 DIAGNOSIS — O09293 Supervision of pregnancy with other poor reproductive or obstetric history, third trimester: Secondary | ICD-10-CM | POA: Diagnosis not present

## 2019-11-25 DIAGNOSIS — Z20822 Contact with and (suspected) exposure to covid-19: Secondary | ICD-10-CM | POA: Diagnosis present

## 2019-11-25 DIAGNOSIS — O3430 Maternal care for cervical incompetence, unspecified trimester: Secondary | ICD-10-CM

## 2019-11-25 LAB — CBC
HCT: 33.3 % — ABNORMAL LOW (ref 36.0–46.0)
Hemoglobin: 11.4 g/dL — ABNORMAL LOW (ref 12.0–15.0)
MCH: 32.8 pg (ref 26.0–34.0)
MCHC: 34.2 g/dL (ref 30.0–36.0)
MCV: 95.7 fL (ref 80.0–100.0)
Platelets: 247 10*3/uL (ref 150–400)
RBC: 3.48 MIL/uL — ABNORMAL LOW (ref 3.87–5.11)
RDW: 13.5 % (ref 11.5–15.5)
WBC: 4.9 10*3/uL (ref 4.0–10.5)
nRBC: 0 % (ref 0.0–0.2)

## 2019-11-25 LAB — TYPE AND SCREEN
ABO/RH(D): A POS
Antibody Screen: NEGATIVE

## 2019-11-25 LAB — SARS CORONAVIRUS 2 BY RT PCR (HOSPITAL ORDER, PERFORMED IN ~~LOC~~ HOSPITAL LAB): SARS Coronavirus 2: NEGATIVE

## 2019-11-25 MED ORDER — PRENATAL MULTIVITAMIN CH
1.0000 | ORAL_TABLET | Freq: Every day | ORAL | Status: DC
  Administered 2019-11-26 – 2019-11-30 (×5): 1 via ORAL
  Filled 2019-11-25 (×5): qty 1

## 2019-11-25 MED ORDER — LACTATED RINGERS IV SOLN: INTRAVENOUS | Status: DC

## 2019-11-25 MED ORDER — DOCUSATE SODIUM 100 MG PO CAPS
100.0000 mg | ORAL_CAPSULE | Freq: Every day | ORAL | Status: DC
  Administered 2019-11-26 – 2019-11-30 (×5): 100 mg via ORAL
  Filled 2019-11-25 (×5): qty 1

## 2019-11-25 MED ORDER — ZOLPIDEM TARTRATE 5 MG PO TABS: 5.0000 mg | ORAL_TABLET | Freq: Every evening | ORAL | Status: DC | PRN

## 2019-11-25 MED ORDER — CALCIUM CARBONATE ANTACID 500 MG PO CHEW: 2.0000 | CHEWABLE_TABLET | ORAL | Status: DC | PRN

## 2019-11-25 MED ORDER — NIFEDIPINE 10 MG PO CAPS
10.0000 mg | ORAL_CAPSULE | ORAL | Status: DC | PRN
  Administered 2019-11-25: 10 mg via ORAL
  Filled 2019-11-25: qty 1

## 2019-11-25 MED ORDER — ACETAMINOPHEN 325 MG PO TABS: 650.0000 mg | ORAL_TABLET | ORAL | Status: DC | PRN

## 2019-11-25 NOTE — MAU Note (Addendum)
Pt reports vaginal bleeding which started 2 hours ago. Also having abdominal cramping. Denies LOF. +FM. Hx of preterm delivery. Has cerclage in place.

## 2019-11-25 NOTE — MAU Provider Note (Addendum)
Chief Complaint:  Vaginal Bleeding and Abdominal Cramping   First Provider Initiated Contact with Patient 11/25/19 2052     HPI: Kathy Wilkerson is a 33 y.o. G3P0110 at 32w2dwho presents to maternity admissions reporting uterine contractions and vaginal bleeding which started about 2 hours ago.  Has a cerclage in place since January.  .Was admitted for the same on 10/24/19 but bleeding was lighter then.  She received the Betamethasone series at that time.  She reports good fetal movement, denies LOF, vaginal itching/burning, urinary symptoms, h/a, dizziness, n/v, diarrhea, constipation or fever/chills.    Vaginal Bleeding The patient's primary symptoms include pelvic pain and vaginal bleeding. The patient's pertinent negatives include no genital itching, genital lesions or genital odor. This is a recurrent problem. The current episode started today. The problem occurs intermittently. The problem has been unchanged. The problem affects both sides. She is pregnant. Associated symptoms include abdominal pain. Pertinent negatives include no chills, constipation, diarrhea, dysuria, fever or headaches. The vaginal discharge was bloody. The vaginal bleeding is heavier than menses. She has been passing clots. She has not been passing tissue. Nothing aggravates the symptoms. She has tried nothing for the symptoms.  Abdominal Cramping This is a recurrent problem. The current episode started today. The problem occurs intermittently. The problem has been unchanged. The pain is at a severity of 8/10. The quality of the pain is cramping. The abdominal pain does not radiate. Pertinent negatives include no constipation, diarrhea, dysuria, fever or headaches. Nothing aggravates the pain. The pain is relieved by nothing. She has tried nothing for the symptoms.   RN Note: Pt reports vaginal bleeding which started 2 hours ago. Also having abdominal cramping. Denies LOF. +FM. Hx of preterm delivery. Has cerclage in place.    Past Medical History: Past Medical History:  Diagnosis Date  . Medical history non-contributory   . Preterm labor     Past obstetric history: OB History  Gravida Para Term Preterm AB Living  3 1   1 1 0  SAB TAB Ectopic Multiple Live Births  1       1    # Outcome Date GA Lbr Len/2nd Weight Sex Delivery Anes PTL Lv  3 Current           2 SAB 10/06/18 [redacted]w[redacted]d   F  None  FD     Birth Comments: Placenta delivered around 1030, large retroplacental clots, EBL 800 ml     Complications: Other Excessive Bleeding  1 Preterm 11/05/17 [redacted]w[redacted]d  2000 g F Vag-Spont  N ND     Birth Comments: bleeding    Past Surgical History: Past Surgical History:  Procedure Laterality Date  . CERVICAL CERCLAGE N/A 07/10/2019   Procedure: CERCLAGE CERVICAL;  Surgeon: Anyanwu, Ugonna A, MD;  Location: [facility]S;  Service: Gynecology;  Laterality: N/A;    Family History: Family History  Problem Relation Age of Onset  . Healthy Mother   . Diabetes Father   . Heart disease Father   . Hypertension Father     Social History: Social History   Tobacco Use  . Smoking status: Never Smoker  . Smokeless tobacco: Never Used  Substance Use Topics  . Alcohol use: Never  . Drug use: Never    Allergies: No Known Allergies  Meds:  Medications Prior to Admission  Medication Sig Dispense Refill Last Dose  . HYDROXYprogesterone caproate (MAKENA) autoinjector Inject 275 mg into the skin every 7 (seven) days.     . prenatal   vitamin w/FE, FA (PRENATAL 1 + 1) 27-1 MG TABS tablet Take 1 tablet by mouth daily at 12 noon. 30 tablet 11     I have reviewed patient's Past Medical Hx, Surgical Hx, Family Hx, Social Hx, medications and allergies.   ROS:  Review of Systems  Constitutional: Negative for chills and fever.  Gastrointestinal: Positive for abdominal pain. Negative for constipation and diarrhea.  Genitourinary: Positive for pelvic pain and vaginal bleeding. Negative for dysuria.  Neurological: Negative  for headaches.   Other systems negative  Physical Exam  No data found. Constitutional: Well-developed, well-nourished female in no acute distress.  Cardiovascular: normal rate and rhythm Respiratory: normal effort, clear to auscultation bilaterally GI: Abd soft, non-tender, gravid appropriate for gestational age.   No rebound or guarding. MS: Extremities nontender, no edema, normal ROM Neurologic: Alert and oriented x 4.  GU: Neg CVAT.  PELVIC EXAM: Cervix pink, without lesion, vaginal walls and external genitalia normal   There is moderate to large amount of blood and clots in vault.   Dilation: 1 Effacement (%): 40 Station: Ballotable Exam by:: M Tameca Jerez, CNM   FHT:  Baseline 140 , moderate variability, accelerations present, no decelerations Contractions:  Irregular, short contractions (about 20-30 sec ea)   Labs: Results for orders placed or performed during the hospital encounter of 11/25/19 (from the past 24 hour(s))  CBC     Status: Abnormal   Collection Time: 11/25/19  9:42 PM  Result Value Ref Range   WBC 4.9 4.0 - 10.5 K/uL   RBC 3.48 (L) 3.87 - 5.11 MIL/uL   Hemoglobin 11.4 (L) 12.0 - 15.0 g/dL   HCT 33.3 (L) 36.0 - 46.0 %   MCV 95.7 80.0 - 100.0 fL   MCH 32.8 26.0 - 34.0 pg   MCHC 34.2 30.0 - 36.0 g/dL   RDW 13.5 11.5 - 15.5 %   Platelets 247 150 - 400 K/uL   nRBC 0.0 0.0 - 0.2 %  Type and screen     Status: None   Collection Time: 11/25/19  9:42 PM  Result Value Ref Range   ABO/RH(D) A POS    Antibody Screen NEG    Sample Expiration      11/28/2019,2359 Performed at [facility], [address] [place], [place] [address]   SARS Coronavirus 2 by RT PCR (hospital order, performed in [facility] lab) Nasopharyngeal Nasopharyngeal Swab     Status: None   Collection Time: 11/25/19 10:43 PM   Specimen: Nasopharyngeal Swab  Result Value Ref Range   SARS Coronavirus 2 NEGATIVE NEGATIVE    --/--/A POS (05/07 0249)  Imaging:  Anterior  placenta, no evidence of abruption AFI 34%ile Cervical length 2.5cm (prev 3.29cm on 10/24/19)  MAU Course/MDM: I have ordered labs and reviewed results.  NST reviewed, reactive, category I with irregular frequent contractions Consult Dr Stinson with presentation, exam findings and test results.  Treatments in MAU included EFM, Procardia series..    Assessment: Single intrauterine pregnancy at [redacted]w[redacted]d Preterm uterine contractions Cerclage in place for history or incompetent cervix Vaginal bleeding   Plan: Admit to OB specialty care unit Routine orders Procardia series Has already had Betamethasone at [redacted]w[redacted]d MD to follow   Cristen Murcia CNM, MSN Certified Nurse-Midwife 11/25/2019 8:52 PM                                                                                                    

## 2019-11-25 NOTE — Progress Notes (Signed)
Stinson MD notified that pt. Refused GC/Chlamydia swab until being able to speak with the doctor.

## 2019-11-26 ENCOUNTER — Encounter (HOSPITAL_COMMUNITY): Payer: Self-pay | Admitting: Family Medicine

## 2019-11-26 DIAGNOSIS — O4693 Antepartum hemorrhage, unspecified, third trimester: Secondary | ICD-10-CM | POA: Diagnosis present

## 2019-11-26 DIAGNOSIS — Z20822 Contact with and (suspected) exposure to covid-19: Secondary | ICD-10-CM | POA: Diagnosis present

## 2019-11-26 DIAGNOSIS — Z3A32 32 weeks gestation of pregnancy: Secondary | ICD-10-CM | POA: Diagnosis not present

## 2019-11-26 DIAGNOSIS — Z3A33 33 weeks gestation of pregnancy: Secondary | ICD-10-CM | POA: Diagnosis not present

## 2019-11-26 DIAGNOSIS — O3433 Maternal care for cervical incompetence, third trimester: Secondary | ICD-10-CM | POA: Diagnosis present

## 2019-11-26 LAB — CBC
HCT: 32 % — ABNORMAL LOW (ref 36.0–46.0)
Hemoglobin: 10.9 g/dL — ABNORMAL LOW (ref 12.0–15.0)
MCH: 32.5 pg (ref 26.0–34.0)
MCHC: 34.1 g/dL (ref 30.0–36.0)
MCV: 95.5 fL (ref 80.0–100.0)
Platelets: 221 10*3/uL (ref 150–400)
RBC: 3.35 MIL/uL — ABNORMAL LOW (ref 3.87–5.11)
RDW: 13.6 % (ref 11.5–15.5)
WBC: 4.9 10*3/uL (ref 4.0–10.5)
nRBC: 0 % (ref 0.0–0.2)

## 2019-11-26 MED ORDER — FERROUS SULFATE 325 (65 FE) MG PO TABS
325.0000 mg | ORAL_TABLET | Freq: Three times a day (TID) | ORAL | Status: DC
  Administered 2019-11-26 – 2019-11-30 (×13): 325 mg via ORAL
  Filled 2019-11-26 (×12): qty 1

## 2019-11-26 NOTE — Progress Notes (Signed)
Patient ID: Kathy Wilkerson, female   DOB: 09/07/1985, 34 y.o.   MRN: [id number] FACULTY PRACTICE ANTEPARTUM(COMPREHENSIVE) NOTE  Kathy MostKhulood Koren is a 34 y.o. G3P0110 at 304w3d  who is admitted for third trimester vaginal bleeding.    Fetal presentation is cephalic. Length of Stay:  0  Days  Date of admission:11/25/2019  Subjective: Patient reports persistent vaginal bleeding, unchanged since admission. She denies cramping or pelvic pain Patient reports the fetal movement as active. Patient reports uterine contraction  activity as none. Patient reports  vaginal bleeding as heavy. Patient describes fluid per vagina as None.  Vitals:  Blood pressure 112/62, pulse 75, temperature 98.1 F (36.7 C), temperature source Oral, resp. rate 18, last menstrual period 04/13/2019, SpO2 100 %. Vitals:   11/26/19 0804 11/26/19 0805 11/26/19 0810 11/26/19 0815  BP: 112/62     Pulse: 75     Resp: 18     Temp: 98.1 F (36.7 C)     TempSrc: Oral     SpO2: 100% 100% 100% 100%   Physical Examination: GENERAL: Well-developed, well-nourished female in no acute distress.  LUNGS: Clear to auscultation bilaterally.  HEART: Regular rate and rhythm. ABDOMEN: Soft, nontender, gravid PELVIC: Not performed. Pad moderately saturated with dark blood. In place since 8 am EXTREMITIES: No cyanosis, clubbing, or edema, 2+ distal pulses.   Fetal Monitoring:  Baseline: 125 bpm, Variability: Good {> 6 bpm), Accelerations: Reactive and Decelerations: Absent   reactive  Labs:  Results for orders placed or performed during the hospital encounter of 11/25/19 (from the past 24 hour(s))  CBC   Collection Time: 11/25/19  9:42 PM  Result Value Ref Range   WBC 4.9 4.0 - 10.5 K/uL   RBC 3.48 (L) 3.87 - 5.11 MIL/uL   Hemoglobin 11.4 (L) 12.0 - 15.0 g/dL   HCT 40.933.3 (L) 81.136.0 - 91.446.0 %   MCV 95.7 80.0 - 100.0 fL   MCH 32.8 26.0 - 34.0 pg   MCHC 34.2 30.0 - 36.0 g/dL   RDW 78.213.5 95.611.5 - 21.315.5 %   Platelets 247 150 - 400 K/uL   nRBC  0.0 0.0 - 0.2 %  Type and screen   Collection Time: 11/25/19  9:42 PM  Result Value Ref Range   ABO/RH(D) A POS    Antibody Screen NEG    Sample Expiration      11/28/2019,2359 Performed at [facility], [address] [place], [place] [address]   SARS Coronavirus 2 by RT PCR (hospital order, performed [facility]) Nasopharyngeal Nasopharyngeal Swab   Collection Time: 11/25/19 10:43 PM   Specimen: Nasopharyngeal Swab  Result Value Ref Range   SARS Coronavirus 2 NEGATIVE NEGATIVE    Imaging Studies:    US MFM OB Transvaginal  Result Date: 11/26/2019 ----------------------------------------------------------------------  OBSTETRICS REPORT                       (Signed Final 11/26/2019 08:34 am) ---------------------------------------------------------------------- Patient Info  ID #:       [id number]                          D.O.B.:  01/20/1986 (33 yrs)  Name:       Kathy Wilkerson                   Visit Date: 11/25/2019 10:32 pm ---------------------------------------------------------------------- Performed By  Attending:        Noralee Spaceavi Shankar  MD        Ref. Address:     [address]                                                             [place], [place]                                                             [address]  Performed By:     Percell Boston          Secondary Phy.:   [facility] MAU/Triage                    RDMS  Referred By:      [facility]          [facility] ---------------------------------------------------------------------- Orders  #  Description                           Code        Ordered By  1  [place] MFM OB LIMITED                     76815.01    Wynelle Bourgeois  2  [place] MFM [facility] TRANSVAGINAL                [phone]     Wynelle Bourgeois ----------------------------------------------------------------------  #  Order #                     Accession #                Episode  #  1  [id number]                   [id number]                 [id number]  2  [id number]                   [id number]                 [id number] ---------------------------------------------------------------------- Indications  [redacted] weeks gestation of pregnancy                Z3A.32  Vaginal bleeding in pregnancy, third trimester O46.93  Poor obstetric history: Previous neonatal      O09.299  death  Cervical cerclage suture present, second       O34.32  trimester  Poor obstetric history: Previous midtrimester  O09.299  loss ---------------------------------------------------------------------- Fetal Evaluation  Num Of Fetuses:         1  Cardiac Activity:       Observed  Presentation:  Cephalic  Placenta:               Anterior  P. Cord Insertion:      Previously Visualized  Amniotic Fluid  AFI FV:      Within normal limits  AFI Sum(cm)     %Tile       Largest Pocket(cm)  12.3            34          3.6  RUQ(cm)       RLQ(cm)       LUQ(cm)        LLQ(cm)  2.2           3             3.5            3.6  Comment:    No placental abruption or previa identified. ---------------------------------------------------------------------- OB History  Gravidity:    3         Term:   0        Prem:   1        SAB:   1  TOP:          0       Ectopic:  0        Living: 0 ---------------------------------------------------------------------- Gestational Age  LMP:           32w 2d        Date:  04/13/19                 EDD:   01/18/20  Best:          [name] 2d     Det. By:  LMP  (04/13/19)          EDD:   01/18/20 ---------------------------------------------------------------------- Anatomy  Thoracic:              Appears normal         Bladder:                Appears normal  Stomach:               Appears normal, left                         sided ---------------------------------------------------------------------- Cervix Uterus Adnexa  Cervix  Length:            2.5  cm.  Normal appearance by transvaginal scan Cerclage  visualized.  Uterus  No abnormality visualized.  Right Ovary  No adnexal mass visualized.  Left Ovary  No adnexal mass visualized.  Cul De Sac  No free fluid seen.  Adnexa  No abnormality visualized. ---------------------------------------------------------------------- Impression  Patient was evaluated for c/o vaginal bleeding .  A limited ultrasound study was performed .Amniotic fluid is  normal and good fetal activity is seen.  Placenta is anterior  and there is no ultrasound evidence of abruption.  We performed transvaginal ultrasound to evaluate the cervix.  The cervix measures 2.5 cm, which is normal for this  gestational age. No evidence of placenta previa.  Patient had prophylactic cerclage in this pregnancy. ----------------------------------------------------------------------                  Noralee Space, MD Electronically Signed Final Report   11/26/2019 08:34 am ----------------------------------------------------------------------  [place] MFM OB Limited  Result Date: 11/26/2019 ----------------------------------------------------------------------  OBSTETRICS REPORT                       (  Signed Final 11/26/2019 08:34 am) ---------------------------------------------------------------------- Patient Info  ID #:       [id number]                          D.O.B.:  [birth year] (33 yrs)  Name:       Kathy Wilkerson                   Visit Date: 11/25/2019 10:32 pm ---------------------------------------------------------------------- Performed By  Attending:        Tama High MD        Ref. Address:     [address]                                                             [place], [place]  Performed By:     Valda Favia          Secondary Phy.:   [facility] MAU/Triage                    RDMS  Referred By:      [facility]          Location:         [facility] [place]  ---------------------------------------------------------------------- Orders  #  Description                           Code        Ordered By  1  [place] MFM OB LIMITED                     76815.01    Hansel Feinstein  2  [place] MFM OB TRANSVAGINAL                [phone]     Hansel Feinstein ----------------------------------------------------------------------  #  Order #                     Accession #                Episode #  1  [id number]                   [id number]                 [id number]  2  [id number]                   [id number]                 [id number] ---------------------------------------------------------------------- Indications  [redacted] weeks gestation of  pregnancy                Z3A.32  Vaginal bleeding in pregnancy, third trimester O46.93  Poor obstetric history: Previous neonatal      O09.299  death  Cervical cerclage suture present, second       O34.32  trimester  Poor obstetric history: Previous midtrimester  O09.299  loss ---------------------------------------------------------------------- Fetal Evaluation  Num Of Fetuses:         1  Cardiac Activity:       Observed  Presentation:           Cephalic  Placenta:               Anterior  P. Cord Insertion:      Previously Visualized  Amniotic Fluid  AFI FV:      Within normal limits  AFI Sum(cm)     %Tile       Largest Pocket(cm)  12.3            34          3.6  RUQ(cm)       RLQ(cm)       LUQ(cm)        LLQ(cm)  2.2           3             3.5            3.6  Comment:    No placental abruption or previa identified. ---------------------------------------------------------------------- OB History  Gravidity:    3         Term:   0        Prem:   1        SAB:   1  TOP:          0       Ectopic:  0        Living: 0 ---------------------------------------------------------------------- Gestational Age  LMP:           32w 2d        Date:  04/13/19                 EDD:   01/18/20  Best:          [name] 2d     Det. By:  LMP  (04/13/19)          EDD:   01/18/20  ---------------------------------------------------------------------- Anatomy  Thoracic:              Appears normal         Bladder:                Appears normal  Stomach:               Appears normal, left                         sided ---------------------------------------------------------------------- Cervix Uterus Adnexa  Cervix  Length:            2.5  cm.  Normal appearance by transvaginal scan Cerclage visualized.  Uterus  No abnormality visualized.  Right Ovary  No adnexal mass visualized.  Left Ovary  No adnexal mass visualized.  Cul De Sac  No free fluid seen.  Adnexa  No abnormality visualized. ---------------------------------------------------------------------- Impression  Patient was evaluated for c/o vaginal bleeding .  A limited ultrasound study was performed .Amniotic fluid is  normal and good fetal activity is seen.  Placenta is anterior  and there is no ultrasound evidence of abruption.  We performed transvaginal ultrasound to evaluate the cervix.  The cervix measures 2.5 cm, which is normal for this  gestational age. No evidence of placenta previa.  Patient had prophylactic cerclage in this pregnancy. ----------------------------------------------------------------------                  Noralee Space, MD Electronically Signed Final Report   11/26/2019 08:34 am ----------------------------------------------------------------------    Medications:  Scheduled . docusate sodium  100 mg Oral Daily  . prenatal multivitamin  1 tablet Oral Q1200   I have reviewed the patient's current medications.  ASSESSMENT:  Patient Active Problem List   Diagnosis Date Noted  . Vaginal bleeding 11/25/2019  . Vaginal bleeding in pregnancy, second trimester 10/24/2019  . Leukopenia 08/03/2019  . Cervical cerclage suture present in third trimester 07/31/2019  . Supervision of high risk pregnancy, antepartum 06/02/2019  . History of incompetent cervix, currently pregnant 10/06/2018  . Previous  preterm delivery, antepartum 08/21/2018  . Language barrier 08/21/2018    PLAN: - Patient is doing well - Fetal status reassuring - Discussed inpatient observation for 7 days starting when vaginal bleeding stops. Discussed possible removal of cerclage if worsening vaginal bleeding - patient completed a course of BMZ 5/7-5/8 - NICU consult - Continue current antepartum care  Burkley Dech 11/26/2019,10:26 AM

## 2019-11-26 NOTE — H&P (Signed)
Chief Complaint:  Vaginal Bleeding and Abdominal Cramping   First Provider Initiated Contact with Patient 11/25/19 2052     HPI: Kathy Wilkerson is a 34 y.o. G3P0110 at 30w2dwho presents to maternity admissions reporting uterine contractions and vaginal bleeding which started about 2 hours ago.  Has a cerclage in place since January.  .Was admitted for the same on 10/24/19 but bleeding was lighter then.  She received the Betamethasone series at that time.  She reports good fetal movement, denies LOF, vaginal itching/burning, urinary symptoms, h/a, dizziness, n/v, diarrhea, constipation or fever/chills.    Vaginal Bleeding The patient's primary symptoms include pelvic pain and vaginal bleeding. The patient's pertinent negatives include no genital itching, genital lesions or genital odor. This is a recurrent problem. The current episode started today. The problem occurs intermittently. The problem has been unchanged. The problem affects both sides. She is pregnant. Associated symptoms include abdominal pain. Pertinent negatives include no chills, constipation, diarrhea, dysuria, fever or headaches. The vaginal discharge was bloody. The vaginal bleeding is heavier than menses. She has been passing clots. She has not been passing tissue. Nothing aggravates the symptoms. She has tried nothing for the symptoms.  Abdominal Cramping This is a recurrent problem. The current episode started today. The problem occurs intermittently. The problem has been unchanged. The pain is at a severity of 8/10. The quality of the pain is cramping. The abdominal pain does not radiate. Pertinent negatives include no constipation, diarrhea, dysuria, fever or headaches. Nothing aggravates the pain. The pain is relieved by nothing. She has tried nothing for the symptoms.   RN Note: Pt reports vaginal bleeding which started 2 hours ago. Also having abdominal cramping. Denies LOF. +FM. Hx of preterm delivery. Has cerclage in place.    Past Medical History: Past Medical History:  Diagnosis Date  . Medical history non-contributory   . Preterm labor     Past obstetric history: OB History  Gravida Para Term Preterm AB Living  3 1   1 1  0  SAB TAB Ectopic Multiple Live Births  1       1    # Outcome Date GA Lbr Len/2nd Weight Sex Delivery Anes PTL Lv  3 Current           2 SAB 10/06/18 [redacted]w[redacted]d   F  None  FD     Birth Comments: Placenta delivered around 1030, large retroplacental clots, EBL 258 ml     Complications: Other Excessive Bleeding  1 Preterm 11/05/17 [redacted]w[redacted]d  2000 g F Vag-Spont  N ND     Birth Comments: bleeding    Past Surgical History: Past Surgical History:  Procedure Laterality Date  . CERVICAL CERCLAGE N/A 07/10/2019   Procedure: CERCLAGE CERVICAL;  Surgeon: Osborne Oman, MD;  Location: [facility] LD ORS;  Service: Gynecology;  Laterality: N/A;    Family History: Family History  Problem Relation Age of Onset  . Healthy Mother   . Diabetes Father   . Heart disease Father   . Hypertension Father     Social History: Social History   Tobacco Use  . Smoking status: Never Smoker  . Smokeless tobacco: Never Used  Substance Use Topics  . Alcohol use: Never  . Drug use: Never    Allergies: No Known Allergies  Meds:  Medications Prior to Admission  Medication Sig Dispense Refill Last Dose  . HYDROXYprogesterone caproate (MAKENA) autoinjector Inject 275 mg into the skin every 7 (seven) days.     . prenatal  vitamin w/FE, FA (PRENATAL 1 + 1) 27-1 MG TABS tablet Take 1 tablet by mouth daily at 12 noon. 30 tablet 11     I have reviewed patient's Past Medical Hx, Surgical Hx, Family Hx, Social Hx, medications and allergies.   ROS:  Review of Systems  Constitutional: Negative for chills and fever.  Gastrointestinal: Positive for abdominal pain. Negative for constipation and diarrhea.  Genitourinary: Positive for pelvic pain and vaginal bleeding. Negative for dysuria.  Neurological: Negative  for headaches.   Other systems negative  Physical Exam  No data found. Constitutional: Well-developed, well-nourished female in no acute distress.  Cardiovascular: normal rate and rhythm Respiratory: normal effort, clear to auscultation bilaterally GI: Abd soft, non-tender, gravid appropriate for gestational age.   No rebound or guarding. MS: Extremities nontender, no edema, normal ROM Neurologic: Alert and oriented x 4.  GU: Neg CVAT.  PELVIC EXAM: Cervix pink, without lesion, vaginal walls and external genitalia normal   There is moderate to large amount of blood and clots in vault.   Dilation: 1 Effacement (%): 40 Station: Ballotable Exam by:: Artelia Laroche, CNM   FHT:  Baseline 140 , moderate variability, accelerations present, no decelerations Contractions:  Irregular, short contractions (about 20-30 sec ea)   Labs: Results for orders placed or performed during the hospital encounter of 11/25/19 (from the past 24 hour(s))  CBC     Status: Abnormal   Collection Time: 11/25/19  9:42 PM  Result Value Ref Range   WBC 4.9 4.0 - 10.5 K/uL   RBC 3.48 (L) 3.87 - 5.11 MIL/uL   Hemoglobin 11.4 (L) 12.0 - 15.0 g/dL   HCT 40.9 (L) 81.1 - 91.4 %   MCV 95.7 80.0 - 100.0 fL   MCH 32.8 26.0 - 34.0 pg   MCHC 34.2 30.0 - 36.0 g/dL   RDW 78.2 95.6 - 21.3 %   Platelets 247 150 - 400 K/uL   nRBC 0.0 0.0 - 0.2 %  Type and screen     Status: None   Collection Time: 11/25/19  9:42 PM  Result Value Ref Range   ABO/RH(D) A POS    Antibody Screen NEG    Sample Expiration      11/28/2019,2359 Performed at [facility], [address] [place], [place] [address]   SARS Coronavirus 2 by RT PCR (hospital order, performed in [facility] hospital lab) Nasopharyngeal Nasopharyngeal Swab     Status: None   Collection Time: 11/25/19 10:43 PM   Specimen: Nasopharyngeal Swab  Result Value Ref Range   SARS Coronavirus 2 NEGATIVE NEGATIVE    --/--/A POS (05/07 0249)  Imaging:  Anterior  placenta, no evidence of abruption AFI 34%ile Cervical length 2.5cm (prev 3.29cm on 10/24/19)  MAU Course/MDM: I have ordered labs and reviewed results.  NST reviewed, reactive, category I with irregular frequent contractions Consult Dr Adrian Blackwater with presentation, exam findings and test results.  Treatments in MAU included EFM, Procardia series..    Assessment: Single intrauterine pregnancy at [redacted]w[redacted]d Preterm uterine contractions Cerclage in place for history or incompetent cervix Vaginal bleeding   Plan: Admit to [facility] specialty care unit Routine orders Procardia series Has already had Betamethasone at [redacted]w[redacted]d MD to follow   Wynelle Bourgeois CNM, MSN Certified Nurse-Midwife 11/25/2019 8:52 PM

## 2019-11-26 NOTE — Plan of Care (Signed)
  Problem: Education: Goal: Knowledge of General Education information will improve Description: Including pain rating scale, medication(s)/side effects and non-pharmacologic comfort measures Outcome: Progressing   Problem: Health Behavior/Discharge Planning: Goal: Ability to manage health-related needs will improve Outcome: Progressing   Problem: Clinical Measurements: Goal: Ability to maintain clinical measurements within normal limits will improve Outcome: Progressing Goal: Will remain free from infection Outcome: Progressing Goal: Diagnostic test results will improve Outcome: Progressing   Problem: Nutrition: Goal: Adequate nutrition will be maintained Outcome: Progressing   Problem: Coping: Goal: Level of anxiety will decrease Outcome: Progressing   Problem: Pain Managment: Goal: General experience of comfort will improve Outcome: Progressing   Problem: Safety: Goal: Ability to remain free from injury will improve Outcome: Progressing   

## 2019-11-27 LAB — CBC
HCT: 30.9 % — ABNORMAL LOW (ref 36.0–46.0)
Hemoglobin: 10.2 g/dL — ABNORMAL LOW (ref 12.0–15.0)
MCH: 31.9 pg (ref 26.0–34.0)
MCHC: 33 g/dL (ref 30.0–36.0)
MCV: 96.6 fL (ref 80.0–100.0)
Platelets: 223 10*3/uL (ref 150–400)
RBC: 3.2 MIL/uL — ABNORMAL LOW (ref 3.87–5.11)
RDW: 14 % (ref 11.5–15.5)
WBC: 4.1 10*3/uL (ref 4.0–10.5)
nRBC: 0 % (ref 0.0–0.2)

## 2019-11-27 NOTE — Progress Notes (Signed)
Patient ID: Kathy Wilkerson, female   DOB: 1985-09-08, 34 y.o.   MRN: [id number] [place]) NOTE  Kathy Wilkerson is a 34 y.o. G3P0110 at [redacted]w[redacted]d who is admitted for third trimester vaginal bleeding.    Fetal presentation is cephalic. Length of Stay:  1  Days  Date of admission:11/25/2019  Subjective: Patient reports no further episodes of vaginal bleeding since this morning. She reports noticing brown discharge when she wipes. Patient is without any other complaints Patient reports the fetal movement as active. Patient reports uterine contraction  activity as none. Patient reports  vaginal bleeding as heavy. Patient describes fluid per vagina as None.  Vitals:  Blood pressure (!) 98/57, pulse 86, temperature 98 F (36.7 C), temperature source Oral, resp. rate 16, last menstrual period 04/13/2019, SpO2 99 %. Vitals:   11/26/19 2114 11/26/19 2341 11/27/19 0539 11/27/19 0842  BP: 122/70 114/65 (!) 109/59 (!) 98/57  Pulse: 88 80 78 86  Resp: 18 18 17 16   Temp: 97.7 F (36.5 C) 98.2 F (36.8 C) 98.2 F (36.8 C) 98 F (36.7 C)  TempSrc: Oral Oral Oral Oral  SpO2: 100% 100% 100% 99%   Physical Examination: GENERAL: Well-developed, well-nourished female in no acute distress.  LUNGS: Clear to auscultation bilaterally.  HEART: Regular rate and rhythm. ABDOMEN: Soft, nontender, gravid PELVIC: Not performed. Pad moderately saturated with dark blood. In place since 8 am EXTREMITIES: No cyanosis, clubbing, or edema, 2+ distal pulses.   Fetal Monitoring:  Baseline: 130 bpm, Variability: Good {> 6 bpm), Accelerations: Reactive and Decelerations: Absent   reactive  Labs:  Results for orders placed or performed during the hospital encounter of 11/25/19 (from the past 24 hour(s))  CBC   Collection Time: 11/26/19 12:19 PM  Result Value Ref Range   WBC 4.9 4.0 - 10.5 K/uL   RBC 3.35 (L) 3.87 - 5.11 MIL/uL   Hemoglobin 10.9 (L) 12.0 - 15.0 g/dL   HCT 32.0 (L) 36 - 46  %   MCV 95.5 80.0 - 100.0 fL   MCH 32.5 26.0 - 34.0 pg   MCHC 34.1 30.0 - 36.0 g/dL   RDW 13.6 11.5 - 15.5 %   Platelets 221 150 - 400 K/uL   nRBC 0.0 0.0 - 0.2 %  CBC   Collection Time: 11/27/19  5:43 AM  Result Value Ref Range   WBC 4.1 4.0 - 10.5 K/uL   RBC 3.20 (L) 3.87 - 5.11 MIL/uL   Hemoglobin 10.2 (L) 12.0 - 15.0 g/dL   HCT 30.9 (L) 36 - 46 %   MCV 96.6 80.0 - 100.0 fL   MCH 31.9 26.0 - 34.0 pg   MCHC 33.0 30.0 - 36.0 g/dL   RDW 14.0 11.5 - 15.5 %   Platelets 223 150 - 400 K/uL   nRBC 0.0 0.0 - 0.2 %    Imaging Studies:    [place] MFM OB Transvaginal  Result Date: 11/26/2019 ----------------------------------------------------------------------  OBSTETRICS REPORT                       (Signed Final 11/26/2019 08:34 am) ---------------------------------------------------------------------- Patient Info  ID #:       [id number]                          D.O.B.:  [birth year] (34 yrs)  Name:       Kathy Wilkerson  Visit Date: 11/25/2019 10:32 pm ---------------------------------------------------------------------- Performed By  Attending:        Noralee Space MD        Ref. Address:     [address]                                                             [place], [place]                                                             [address]  Performed By:     Percell Boston          Secondary Phy.:   [facility] MAU/Triage                    RDMS  Referred By:      [facility]          [facility] ---------------------------------------------------------------------- Orders  #  Description                           Code        Ordered By  1  [place] MFM OB LIMITED                     76815.01    Wynelle Bourgeois  2  [place] MFM [facility] TRANSVAGINAL                [phone]     Wynelle Bourgeois ----------------------------------------------------------------------  #  Order #                     Accession #                 Episode #  1  [id number]                   [id number]                 [id number]  2  [id number]                   [id number]                 [id number] ---------------------------------------------------------------------- Indications  [redacted] weeks gestation of pregnancy                Z3A.32  Vaginal bleeding in pregnancy, third trimester O46.93  Poor obstetric history: Previous neonatal      O09.299  death  Cervical cerclage suture present, second       O34.32  trimester  Poor obstetric history: Previous midtrimester  O09.299  loss ---------------------------------------------------------------------- Fetal Evaluation  Num Of Fetuses:  1  Cardiac Activity:       Observed  Presentation:           Cephalic  Placenta:               Anterior  P. Cord Insertion:      Previously Visualized  Amniotic Fluid  AFI FV:      Within normal limits  AFI Sum(cm)     %Tile       Largest Pocket(cm)  12.3            34          3.6  RUQ(cm)       RLQ(cm)       LUQ(cm)        LLQ(cm)  2.2           3             3.5            3.6  Comment:    No placental abruption or previa identified. ---------------------------------------------------------------------- OB History  Gravidity:    3         Term:   0        Prem:   1        SAB:   1  TOP:          0       Ectopic:  0        Living: 0 ---------------------------------------------------------------------- Gestational Age  LMP:           32w 2d        Date:  04/13/19                 EDD:   01/18/20  Best:          [name] 2d     Det. By:  LMP  (04/13/19)          EDD:   01/18/20 ---------------------------------------------------------------------- Anatomy  Thoracic:              Appears normal         Bladder:                Appears normal  Stomach:               Appears normal, left                         sided ---------------------------------------------------------------------- Cervix Uterus Adnexa  Cervix  Length:            2.5  cm.  Normal appearance by transvaginal scan  Cerclage visualized.  Uterus  No abnormality visualized.  Right Ovary  No adnexal mass visualized.  Left Ovary  No adnexal mass visualized.  Cul De Sac  No free fluid seen.  Adnexa  No abnormality visualized. ---------------------------------------------------------------------- Impression  Patient was evaluated for c/o vaginal bleeding .  A limited ultrasound study was performed .Amniotic fluid is  normal and good fetal activity is seen.  Placenta is anterior  and there is no ultrasound evidence of abruption.  We performed transvaginal ultrasound to evaluate the cervix.  The cervix measures 2.5 cm, which is normal for this  gestational age. No evidence of placenta previa.  Patient had prophylactic cerclage in this pregnancy. ----------------------------------------------------------------------                  Kathy Space, MD Electronically Signed Final Report   11/26/2019 08:34 am ----------------------------------------------------------------------  [place] MFM OB Limited  Result Date: 11/26/2019 ----------------------------------------------------------------------  OBSTETRICS REPORT                       (Signed Final 11/26/2019 08:34 am) ---------------------------------------------------------------------- Patient Info  ID #:       [id number]                          D.O.B.:  09/30/1985 (33 yrs)  Name:       KURSTEN KRUK                   Visit Date: 11/25/2019 10:32 pm ---------------------------------------------------------------------- Performed By  Attending:        Noralee Space MD        Ref. Address:     [address]                                                             [place], [place]                                                             [address]  Performed By:     Percell Boston          Secondary Phy.:   [facility] MAU/Triage                    RDMS  Referred By:      [facility]          [facility]  ---------------------------------------------------------------------- Orders  #  Description                           Code        Ordered By  1  [place] MFM OB LIMITED                     76815.01    Wynelle Bourgeois  2  [place] MFM [facility] TRANSVAGINAL                [phone]     Wynelle Bourgeois ----------------------------------------------------------------------  #  Order #                     Accession #                Episode #  1  [id number]                   [id number]                 [id number]  2  [id number]  [id number]                 [id number] ---------------------------------------------------------------------- Indications  [redacted] weeks gestation of pregnancy                Z3A.32  Vaginal bleeding in pregnancy, third trimester O46.93  Poor obstetric history: Previous neonatal      O09.299  death  Cervical cerclage suture present, second       O34.32  trimester  Poor obstetric history: Previous midtrimester  O09.299  loss ---------------------------------------------------------------------- Fetal Evaluation  Num Of Fetuses:         1  Cardiac Activity:       Observed  Presentation:           Cephalic  Placenta:               Anterior  P. Cord Insertion:      Previously Visualized  Amniotic Fluid  AFI FV:      Within normal limits  AFI Sum(cm)     %Tile       Largest Pocket(cm)  12.3            34          3.6  RUQ(cm)       RLQ(cm)       LUQ(cm)        LLQ(cm)  2.2           3             3.5            3.6  Comment:    No placental abruption or previa identified. ---------------------------------------------------------------------- OB History  Gravidity:    3         Term:   0        Prem:   1        SAB:   1  TOP:          0       Ectopic:  0        Living: 0 ---------------------------------------------------------------------- Gestational Age  LMP:           32w 2d        Date:  04/13/19                 EDD:   01/18/20  Best:          [name]32w 2d     Det. By:  LMP  (04/13/19)          EDD:   01/18/20  ---------------------------------------------------------------------- Anatomy  Thoracic:              Appears normal         Bladder:                Appears normal  Stomach:               Appears normal, left                         sided ---------------------------------------------------------------------- Cervix Uterus Adnexa  Cervix  Length:            2.5  cm.  Normal appearance by transvaginal scan Cerclage visualized.  Uterus  No abnormality visualized.  Right Ovary  No adnexal mass visualized.  Left Ovary  No adnexal mass visualized.  Cul De Sac  No free fluid seen.  Adnexa  No abnormality visualized. ---------------------------------------------------------------------- Impression  Patient  was evaluated for c/o vaginal bleeding .  A limited ultrasound study was performed .Amniotic fluid is  normal and good fetal activity is seen.  Placenta is anterior  and there is no ultrasound evidence of abruption.  We performed transvaginal ultrasound to evaluate the cervix.  The cervix measures 2.5 cm, which is normal for this  gestational age. No evidence of placenta previa.  Patient had prophylactic cerclage in this pregnancy. ----------------------------------------------------------------------                  Kathy Space, MD Electronically Signed Final Report   11/26/2019 08:34 am ----------------------------------------------------------------------    Medications:  Scheduled . docusate sodium  100 mg Oral Daily  . ferrous sulfate  325 mg Oral TID WC  . prenatal multivitamin  1 tablet Oral Q1200   I have reviewed the patient's current medications.  ASSESSMENT:  Patient Active Problem List   Diagnosis Date Noted  . Vaginal bleeding 11/25/2019  . Vaginal bleeding in pregnancy, second trimester 10/24/2019  . Leukopenia 08/03/2019  . Cervical cerclage suture present in third trimester 07/31/2019  . Supervision of high risk pregnancy, antepartum 06/02/2019  . History of incompetent cervix,  currently pregnant 10/06/2018  . Previous preterm delivery, antepartum 08/21/2018  . Language barrier 08/21/2018    PLAN: - Patient is doing well - Fetal status reassuring - Discussed inpatient observation for 7 days  - patient completed a course of BMZ 5/7-5/8 - Continue current antepartum care  Kathy Wilkerson 11/27/2019,9:56 AM

## 2019-11-28 MED ORDER — SODIUM CHLORIDE 0.9% FLUSH
3.0000 mL | Freq: Two times a day (BID) | INTRAVENOUS | Status: DC
  Administered 2019-11-28: 3 mL via INTRAVENOUS

## 2019-11-28 MED ORDER — SODIUM CHLORIDE 0.9% FLUSH: 3.0000 mL | INTRAVENOUS | Status: DC | PRN

## 2019-11-28 MED ORDER — SODIUM CHLORIDE 0.9 % IV SOLN: 250.0000 mL | INTRAVENOUS | Status: DC | PRN

## 2019-11-28 NOTE — Progress Notes (Signed)
Patient ID: Kathy Wilkerson, female   DOB: 1986/01/28, 34 y.o.   MRN: [id number] ACULTY PRACTICE ANTEPARTUM COMPREHENSIVE PROGRESS NOTE  Kathy Wilkerson is a 34 y.o. G3P0110 at [redacted]w[redacted]d  who is admitted for third trimester vaginal bleeding.   Fetal presentation is cephalic. Length of Stay:  2  Days  Subjective: Pt without complaints this morning. Wants to go home Patient reports good fetal movement.  She reports no uterine contractions, no bleeding and no loss of fluid per vagina.  Vitals:  Blood pressure 112/66, pulse 79, temperature 97.8 F (36.6 C), temperature source Oral, resp. rate 18, last menstrual period 04/13/2019, SpO2 100 %.   Physical Examination: Lungs clear Heart RRR Abd soft + BS gravid non tender Ext non tender  Fetal Monitoring:  120-130's, + acels, no decles, no ut ctx  Labs:  No results found for this or any previous visit (from the past 24 hour(s)).  Imaging Studies:    NA   Medications:  Scheduled . docusate sodium  100 mg Oral Daily  . ferrous sulfate  325 mg Oral TID WC  . prenatal multivitamin  1 tablet Oral Q1200  . sodium chloride flush  3 mL Intravenous Q12H   I have reviewed the patient's current medications.  ASSESSMENT: IUP 32 5/7 weeks Third trimester vaginal bleeding, last episode on 11/26/19 H/O 19 week FDIU H/O incompetent, s/p cerclage   PLAN: Stable. S/P BMZ. Continue with 7 days of observation Continue routine antenatal care.   Kathy Wilkerson 11/28/2019,10:11 AM

## 2019-11-29 ENCOUNTER — Encounter (HOSPITAL_COMMUNITY): Payer: Self-pay | Admitting: Obstetrics and Gynecology

## 2019-11-29 DIAGNOSIS — Z3A32 32 weeks gestation of pregnancy: Secondary | ICD-10-CM

## 2019-11-29 DIAGNOSIS — O3433 Maternal care for cervical incompetence, third trimester: Secondary | ICD-10-CM

## 2019-11-29 DIAGNOSIS — O4693 Antepartum hemorrhage, unspecified, third trimester: Principal | ICD-10-CM

## 2019-11-29 LAB — TYPE AND SCREEN
ABO/RH(D): A POS
Antibody Screen: NEGATIVE

## 2019-11-29 NOTE — Plan of Care (Signed)
  Problem: Education: Goal: Knowledge of General Education information will improve Description: Including pain rating scale, medication(s)/side effects and non-pharmacologic comfort measures Outcome: Progressing   Problem: Coping: Goal: Level of anxiety will decrease Outcome: Progressing   

## 2019-11-29 NOTE — Progress Notes (Signed)
Patient ID: Kathy Wilkerson, female   DOB: [birth year], 34 y.o.   MRN: [id number] FACULTY PRACTICE ANTEPARTUM(COMPREHENSIVE) NOTE  Kathy Wilkerson is a 34 y.o. G3P0110 at [redacted]w[redacted]d by best clinical estimate who is admitted for 3rd trimester bleeding.   Fetal presentation is cephalic. Length of Stay:  3  Days  ASSESSMENT: Active Problems:   Cervical cerclage suture present in third trimester   Vaginal bleeding   PLAN: In hospital monitoring x 5-7 days. She prefers 5 as she has had no further bleeding.  Subjective: Feels well. No complaints Patient reports the fetal movement as active. Patient reports uterine contraction  activity as none. Patient reports  vaginal bleeding as none. Patient describes fluid per vagina as None.  Vitals:  Blood pressure 107/65, pulse 91, temperature 98 F (36.7 C), temperature source Oral, resp. rate 18, last menstrual period 04/13/2019, SpO2 99 %. Physical Examination:  General appearance - alert, well appearing, and in no distress Chest - normal effort Abdomen - gravid, non-tender Fundal Height:  size equals dates Extremities: Homans sign is negative, no sign of DVT  Membranes:intact  Fetal Monitoring:  Baseline: 135 bpm, Variability: Good {> 6 bpm), Accelerations: Reactive and Decelerations: Absent from yesterday   Medications:  Scheduled . docusate sodium  100 mg Oral Daily  . ferrous sulfate  325 mg Oral TID WC  . prenatal multivitamin  1 tablet Oral Q1200  . sodium chloride flush  3 mL Intravenous Q12H   I have reviewed the patient's current medications.   Reva Bores, MD 11/29/2019,10:08 AM

## 2019-11-30 ENCOUNTER — Encounter (HOSPITAL_COMMUNITY): Payer: Self-pay | Admitting: Obstetrics and Gynecology

## 2019-11-30 DIAGNOSIS — Z3A33 33 weeks gestation of pregnancy: Secondary | ICD-10-CM

## 2019-11-30 NOTE — Progress Notes (Signed)
Patient ID: Kathy Wilkerson, female   DOB: 1985/07/14, 34 y.o.   MRN: [id number] FACULTY PRACTICE ANTEPARTUM(COMPREHENSIVE) NOTE  AR[employer] Language Line used for communication  Kathy Wilkerson is a 34 y.o. [phone] with Estimated Date of Delivery: 01/18/20   By   [redacted]w[redacted]d  who is admitted for 3rd trimester bleeding.    Fetal presentation is cephalic. Length of Stay:  4  Days  Date of admission:11/25/2019  Subjective:  Patient reports the fetal movement as active. Patient reports uterine contraction  activity as none. Patient reports  vaginal bleeding as none. Patient describes fluid per vagina as None.  Vitals:  Blood pressure 111/64, pulse 85, temperature 97.7 F (36.5 C), temperature source Oral, resp. rate 16, height 5\' 3"  (1.6 m), weight 59.1 kg, last menstrual period 04/13/2019, SpO2 98 %. Vitals:   11/29/19 1604 11/29/19 1928 11/29/19 2323 11/30/19 0458  BP: 114/67 120/64 111/64   Pulse: 73 72 85   Resp: 16 17 16    Temp: 98.1 F (36.7 C) 98.4 F (36.9 C) 97.7 F (36.5 C)   TempSrc: Oral Oral Oral   SpO2: 100% 100% 98%   Weight:    59.1 kg  Height:    5\' 3"  (1.6 m)   Physical Examination:  General appearance - alert, well appearing, and in no distress Abdomen - soft, nontender, nondistended, no masses or organomegaly Fundal Height:  size equals dates Pelvic Exam:  examination not indicated Cervical Exam: Not evaluated. . Extremities: extremities normal, atraumatic, no cyanosis or edema with DTRs 2+ bilaterally Membranes:intact  Fetal Monitoring:  Baseline: 140 bpm, Variability: Good {> 6 bpm), Accelerations: Reactive and Decelerations: Absent   reactive  Labs:  Results for orders placed or performed during the hospital encounter of 11/25/19 (from the past 24 hour(s))  Type and screen [facility]   Collection Time: 11/29/19 10:15 AM  Result Value Ref Range   ABO/RH(D) A POS    Antibody Screen NEG    Sample Expiration      12/02/2019,2359 Performed at [facility], [address] [place], [facility] [address]     Imaging Studies:    No results found.   Medications:  Scheduled . docusate sodium  100 mg Oral Daily  . ferrous sulfate  325 mg Oral TID WC  . prenatal multivitamin  1 tablet Oral Q1200  . sodium chloride flush  3 mL Intravenous Q12H   I have reviewed the patient's current medications.  ASSESSMENT: G3P0110 [redacted]w[redacted]d Estimated Date of Delivery: 01/18/20  Patient Active Problem List   Diagnosis Date Noted  . Vaginal bleeding 11/25/2019  . Vaginal bleeding in pregnancy, second trimester 10/24/2019  . Leukopenia 08/03/2019  . Cervical cerclage suture present in third trimester 07/31/2019  . Supervision of high risk pregnancy, antepartum 06/02/2019  . History of incompetent cervix, currently pregnant 10/06/2018  . Previous preterm delivery, antepartum 08/21/2018  . Language barrier 08/21/2018    PLAN: >if no further bleeding patient to be discharged tomorrow am, 12/01/19  10/21/2018 Kathy Wilkerson 11/30/2019,7:53 AM

## 2019-11-30 NOTE — Consult Note (Signed)
Neonatology Consult  Note:  At the request of the patients obstetrician Dr. Elonda Husky I met with Kathy Wilkerson and her husband.  She is a 34 y.o. G3P0110 with Estimated Date of Delivery: 01/18/20, currently 68w0dwho is admitted for 3rd trimester bleeding.    We reviewed initial delivery room management, including CPAP, [place], and low but certainly possible need for intubation for surfactant administration.  We discussed feeding immaturity and need for full po intake with multiple days of good weight gain and no apnea or bradycardia before discharge.  We reviewed increased risk of jaundice, infection, and temperature instability.   Discussed likely length of stay.  Thank you for allowing [facility] participate in her care.  Please call with questions.  BHiginio Roger DO  Neonatologist  The total length of face-to-face or floor / unit time for this encounter was 20 minutes.  Counseling and / or coordination of care was greater than fifty percent of the time.

## 2019-12-01 ENCOUNTER — Encounter: Payer: Medicaid Other | Admitting: Obstetrics & Gynecology

## 2019-12-01 MED ORDER — NIFEDIPINE 10 MG PO CAPS: 10.0000 mg | ORAL_CAPSULE | ORAL | 2 refills | Status: DC | PRN

## 2019-12-01 MED ORDER — FERROUS SULFATE 325 (65 FE) MG PO TABS: 325.0000 mg | ORAL_TABLET | Freq: Three times a day (TID) | ORAL | 3 refills | Status: DC

## 2019-12-01 NOTE — Discharge Summary (Signed)
Physician Discharge Summary  Patient ID: Kathy Wilkerson MRN: [id number] DOB/AGE: 25-Nov-1985 34 y.o.  Admit date: 11/25/2019 Discharge date: 12/01/2019  Admission Diagnoses: Vaginal bleeding in the third trimester  Discharge Diagnoses:  Active Problems:   Cervical cerclage suture present in third trimester   Vaginal bleeding   Discharged Condition: good  Hospital Course: admitted with a more substantial episode of vaginal bleeding has cerclage in place, the bleeding subsided at admission and has not recurred Plan was to discharge in 5 days if no further bleeding  Consults: None  Significant Diagnostic Studies: sonogram  Treatments: hospital observation  Discharge Exam: Blood pressure 112/66, pulse 90, temperature 98.1 F (36.7 C), temperature source Oral, resp. rate 18, height 5\' 3"  (1.6 m), weight 59.1 kg, last menstrual period 04/13/2019, SpO2 100 %. General appearance: alert, cooperative and no distress GI: soft, non-tender; bowel sounds normal; no masses,  no organomegaly  Disposition: Discharge disposition: 01-Home or Self Care       Discharge Instructions    Diet - low sodium heart healthy   Complete by: As directed    Increase activity slowly   Complete by: As directed    Sexual Activity Restrictions   Complete by: As directed    No sex for the remainder of pregnancy     Allergies as of 12/01/2019   No Known Allergies     Medication List    TAKE these medications   ferrous sulfate 325 (65 FE) MG tablet Take 1 tablet (325 mg total) by mouth 3 (three) times daily with meals.   HYDROXYprogesterone caproate autoinjector Commonly known as: Makena Inject 275 mg into the skin every 7 (seven) days.   NIFEdipine 10 MG capsule Commonly known as: PROCARDIA Take 1 capsule (10 mg total) by mouth every 20 (twenty) minutes as needed (preterm contractions).   prenatal vitamin w/FE, FA 27-1 MG Tabs tablet Take 1 tablet by mouth daily at 12 noon.         Signed6/16/2021 12/01/2019, 8:28 AM

## 2019-12-01 NOTE — Progress Notes (Signed)
Discharge AVS given to patient and reviewed with interpreter.  Discussed follow up appointment time, medications and preterm labor/vaginal bleeding instructions. Patient verbalized understanding.

## 2019-12-01 NOTE — Discharge Instructions (Signed)
Vaginal Bleeding During Pregnancy, Third Trimester ° °A small amount of bleeding (spotting) from the vagina is common during pregnancy. Sometimes the bleeding is normal and is not a problem, and sometimes it is a sign of something serious. Tell your doctor about any bleeding from your vagina right away. °Follow these instructions at home: °Activity °· Follow your doctor's instructions about how active you can be. Your doctor may recommend that you: °? Stay in bed and only get up to use the bathroom. °? Continue light activity. °· If needed, make plans for someone to help you with your normal activities. °· Ask your doctor if it is safe for you to drive. °· Do not lift anything that is heavier than 10 lb (4.5 kg) until your doctor says that this is safe. °· Do not have sex or orgasms until your doctor says that this is safe. °Medicines °· Take over-the-counter and prescription medicines only as told by your doctor. °· Do not take aspirin. It can cause bleeding. °General instructions °· Watch your condition for any changes. °· Write down: °? The number of pads you use each day. °? How often you change pads. °? How soaked (saturated) your pads are. °· Do not use tampons. °· Do not douche. °· If you pass any tissue from your vagina, save the tissue to show your doctor. °· Keep all follow-up visits as told by your doctor. This is important. °Contact a doctor if: °· You have vaginal bleeding at any time during pregnancy. °· You have cramps. °· You have a fever. °Get help right away if: °· You have very bad cramps. °· You have very bad pain in your back or belly (abdomen). °· You have a gush of fluid from your vagina. °· You pass large clots or a lot of tissue from your vagina. °· Your bleeding gets worse. °· You feel light-headed or weak. °· You pass out (faint). °· Your baby is moving less than usual, or not moving at all. °Summary °· Tell your doctor about any bleeding from your vagina right away. °· Follow instructions  from your doctor about how active you can be. You may need someone to help you with your normal activities. °This information is not intended to replace advice given to you by your health care provider. Make sure you discuss any questions you have with your health care provider. °Document Revised: 09/24/2018 Document Reviewed: 09/06/2016 °Elsevier Patient Education © 2020 [employer] ° °

## 2019-12-08 ENCOUNTER — Encounter: Payer: Medicaid Other | Admitting: Obstetrics & Gynecology

## 2019-12-09 ENCOUNTER — Ambulatory Visit (INDEPENDENT_AMBULATORY_CARE_PROVIDER_SITE_OTHER): Payer: Medicaid Other | Admitting: Women's Health

## 2019-12-09 ENCOUNTER — Other Ambulatory Visit: Payer: Self-pay

## 2019-12-09 ENCOUNTER — Ambulatory Visit: Payer: Medicaid Other

## 2019-12-09 VITALS — BP 108/74 | HR 91 | Wt 131.5 lb

## 2019-12-09 DIAGNOSIS — O0993 Supervision of high risk pregnancy, unspecified, third trimester: Secondary | ICD-10-CM

## 2019-12-09 DIAGNOSIS — Z3A34 34 weeks gestation of pregnancy: Secondary | ICD-10-CM

## 2019-12-09 DIAGNOSIS — Z8759 Personal history of other complications of pregnancy, childbirth and the puerperium: Secondary | ICD-10-CM

## 2019-12-09 DIAGNOSIS — O3433 Maternal care for cervical incompetence, third trimester: Secondary | ICD-10-CM

## 2019-12-09 DIAGNOSIS — O09299 Supervision of pregnancy with other poor reproductive or obstetric history, unspecified trimester: Secondary | ICD-10-CM

## 2019-12-09 DIAGNOSIS — O0990 Supervision of high risk pregnancy, unspecified, unspecified trimester: Secondary | ICD-10-CM

## 2019-12-09 DIAGNOSIS — Z789 Other specified health status: Secondary | ICD-10-CM

## 2019-12-09 DIAGNOSIS — O09213 Supervision of pregnancy with history of pre-term labor, third trimester: Secondary | ICD-10-CM

## 2019-12-09 DIAGNOSIS — O09219 Supervision of pregnancy with history of pre-term labor, unspecified trimester: Secondary | ICD-10-CM

## 2019-12-09 DIAGNOSIS — D72819 Decreased white blood cell count, unspecified: Secondary | ICD-10-CM

## 2019-12-09 MED ORDER — HYDROXYPROGESTERONE CAPROATE 275 MG/1.1ML ~~LOC~~ SOAJ: 275.0000 mg | Freq: Once | SUBCUTANEOUS | Status: DC

## 2019-12-09 NOTE — Patient Instructions (Addendum)
Maternity Assessment Unit (MAU)  The Maternity Assessment Unit (MAU) is located at the [facility] at [facility]. The address is: [address], [address], [place], [place] [address]. Please see map below for additional directions.    The Maternity Assessment Unit is designed to help you during your pregnancy, and for up to 6 weeks after delivery, with any pregnancy- or postpartum-related emergencies, if you think you are in labor, or if your water has broken. For example, if you experience nausea and vomiting, vaginal bleeding, severe abdominal or pelvic pain, elevated blood pressure or other problems related to your pregnancy or postpartum time, please come to the Maternity Assessment Unit for assistance.        Preterm Labor and Birth Information  The normal length of a pregnancy is 39-41 weeks. Preterm labor is when labor starts before 37 completed weeks of pregnancy. What are the risk factors for preterm labor? Preterm labor is more likely to occur in women who:  Have certain infections during pregnancy such as a bladder infection, sexually transmitted infection, or infection inside the uterus (chorioamnionitis).  Have a shorter-than-normal cervix.  Have gone into preterm labor before.  Have had surgery on their cervix.  Are younger than age 17 or older than age 35.  Are African American.  Are pregnant with twins or multiple babies (multiple gestation).  Take street drugs or smoke while pregnant.  Do not gain enough weight while pregnant.  Became pregnant shortly after having been pregnant. What are the symptoms of preterm labor? Symptoms of preterm labor include:  Cramps similar to those that can happen during a menstrual period. The cramps may happen with diarrhea.  Pain in the abdomen or lower back.  Regular uterine contractions that may feel like tightening of the abdomen.  A feeling of increased pressure in the  pelvis.  Increased watery or bloody mucus discharge from the vagina.  Water breaking (ruptured amniotic sac). Why is it important to recognize signs of preterm labor? It is important to recognize signs of preterm labor because babies who are born prematurely may not be fully developed. This can put them at an increased risk for:  Long-term (chronic) heart and lung problems.  Difficulty immediately after birth with regulating body systems, including blood sugar, body temperature, heart rate, and breathing rate.  Bleeding in the brain.  Cerebral palsy.  Learning difficulties.  Death. These risks are highest for babies who are born before 34 weeks of pregnancy. How is preterm labor treated? Treatment depends on the length of your pregnancy, your condition, and the health of your baby. It may involve:  Having a stitch (suture) placed in your cervix to prevent your cervix from opening too early (cerclage).  Taking or being given medicines, such as: ? Hormone medicines. These may be given early in pregnancy to help support the pregnancy. ? Medicine to stop contractions. ? Medicines to help mature the baby's lungs. These may be prescribed if the risk of delivery is high. ? Medicines to prevent your baby from developing cerebral palsy. If the labor happens before 34 weeks of pregnancy, you may need to stay in the hospital. What should I do if I think I am in preterm labor? If you think that you are going into preterm labor, call your health care provider right away. How can I prevent preterm labor in future pregnancies? To increase your chance of having a full-term pregnancy:  Do not use any tobacco products, such as   cigarettes, chewing tobacco, and e-cigarettes. If you need help quitting, ask your health care provider.  Do not use street drugs or medicines that have not been prescribed to you during your pregnancy.  Talk with your health care provider before taking any herbal  supplements, even if you have been taking them regularly.  Make sure you gain a healthy amount of weight during your pregnancy.  Watch for infection. If you think that you might have an infection, get it checked right away.  Make sure to tell your health care provider if you have gone into preterm labor before. This information is not intended to replace advice given to you by your health care provider. Make sure you discuss any questions you have with your health care provider. Document Revised: 09/27/2018 Document Reviewed: 10/27/2015 Elsevier Patient Education  2020 Elsevier Inc.         Group B Streptococcus Test During Pregnancy Why am I having this test? Routine testing, also called screening, for group B streptococcus (GBS) is recommended for all pregnant women between the 36th and 37th week of pregnancy. GBS is a type of bacteria that can be passed from mother to baby during childbirth. Screening will help guide whether or not you will need treatment during labor and delivery to prevent complications such as:  An infection in your uterus during labor.  An infection in your uterus after delivery.  A serious infection in your baby after delivery, such as pneumonia, meningitis, or sepsis. GBS screening is not often done before 36 weeks of pregnancy unless you go into labor prematurely. What happens if I have group B streptococcus? If testing shows that you have GBS, your health care provider will recommend treatment with IV antibiotics during labor and delivery. This treatment significantly decreases the risk of complications for you and your baby. If you have a planned C-section and you have GBS, you may not need to be treated with antibiotics because GBS is usually passed to babies after labor starts and your water breaks. If you are in labor or your water breaks before your C-section, it is possible for GBS to get into your uterus and be passed to your baby, so you might need  treatment. Is there a chance I may not need to be tested? You may not need to be tested for GBS if:  You have a urine test that shows GBS before 36 to 37 weeks.  You had a baby with GBS infection after a previous delivery. In these cases, you will automatically be treated for GBS during labor and delivery. What is being tested? This test is done to check if you have group B streptococcus in your vagina or rectum. What kind of sample is taken? To collect samples for this test, your health care provider will swab your vagina and rectum with a cotton swab. The sample is then sent to the lab to see if GBS is present. What happens during the test?   You will remove your clothing from the waist down.  You will lie down on an exam table in the same position as you would for a pelvic exam.  Your health care provider will swab your vagina and rectum to collect samples for a culture test.  You will be able to go home after the test and do all your usual activities. How are the results reported? The test results are reported as positive or negative. What do the results mean?  A positive test means you are   at risk for passing GBS to your baby during labor and delivery. Your health care provider will recommend that you are treated with an IV antibiotic during labor and delivery.  A negative test means you are at very low risk of passing GBS to your baby. There is still a low risk of passing GBS to your baby because sometimes test results may report that you do not have a condition when you do (false-negative result) or there is a chance that you may become infected with GBS after the test is done. You most likely will not need to be treated with an antibiotic during labor and delivery. Talk with your health care provider about what your results mean. Questions to ask your health care provider Ask your health care provider, or the department that is doing the test:  When will my results be  ready?  How will I get my results?  What are my treatment options? Summary  Routine testing (screening) for group B streptococcus (GBS) is recommended for all pregnant women between the 36th and 37th week of pregnancy.  GBS is a type of bacteria that can be passed from mother to baby during childbirth.  If testing shows that you have GBS, your health care provider will recommend that you are treated with IV antibiotics during labor and delivery. This treatment almost always prevents infection in newborns. This information is not intended to replace advice given to you by your health care provider. Make sure you discuss any questions you have with your health care provider. Document Revised: 09/26/2018 Document Reviewed: 07/03/2018 Elsevier Patient Education  2020 Elsevier Inc.  

## 2019-12-09 NOTE — Progress Notes (Signed)
Subjective:  Kathy Wilkerson is a 34 y.o. G3P0110 at [redacted]w[redacted]d being seen today for ongoing prenatal care.  She is currently monitored for the following issues for this high-risk pregnancy and has Previous preterm delivery, antepartum; Language barrier; History of incompetent cervix, currently pregnant; Supervision of high risk pregnancy, antepartum; Cervical cerclage suture present in third trimester; Leukopenia; Vaginal bleeding in pregnancy, second trimester; and Vaginal bleeding on their problem list.  Patient reports no complaints.  Contractions: Not present. Vag. Bleeding: None.  Movement: Present. Denies leaking of fluid.   The following portions of the patient's history were reviewed and updated as appropriate: allergies, current medications, past family history, past medical history, past social history, past surgical history and problem list. Problem list updated.  Objective:   Vitals:   12/09/19 0856  BP: 108/74  Pulse: 91  Weight: 131 lb 8 oz (59.6 kg)    Fetal Status: Fetal Heart Rate (bpm): 138   Movement: Present     General:  Alert, oriented and cooperative. Patient is in no acute distress.  Skin: Skin is warm and dry. No rash noted.   Cardiovascular: Normal heart rate noted  Respiratory: Normal respiratory effort, no problems with respiration noted  Abdomen: Soft, gravid, appropriate for gestational age. Pain/Pressure: Absent     Pelvic: Vag. Bleeding: None     Cervical exam deferred        Extremities: Normal range of motion.  Edema: None  Mental Status: Normal mood and affect. Normal behavior. Normal judgment and thought content.   Urinalysis:      Assessment and Plan:  Pregnancy: G3P0110 at [redacted]w[redacted]d  1. Supervision of high risk pregnancy, antepartum -GBS/cultures next visit  2. Language barrier -Print production planner used for entire visit  3. Cervical cerclage suture present in third trimester  4. History of incompetent cervix, currently pregnant -cerclage in place,  no issues  5. Previous preterm delivery, antepartum -On 17 P-continue, trial of anti-histamine prior to dose. -pt out of [facility], will order tonight for overnight delivery  6. Leukopenia, unspecified type -CBC 11/27/2019, WBCs 4.1  Preterm labor symptoms and general obstetric precautions including but not limited to vaginal bleeding, contractions, leaking of fluid and fetal movement were reviewed in detail with the patient. Please refer to After Visit Summary for other counseling recommendations.  Return in about 2 weeks (around 12/23/2019) for in-person HOB/MD ONLY/GBS/Cultures.   Collier Monica, Odie Sera, NP

## 2019-12-18 ENCOUNTER — Encounter (HOSPITAL_COMMUNITY): Payer: Self-pay | Admitting: Obstetrics and Gynecology

## 2019-12-18 ENCOUNTER — Other Ambulatory Visit: Payer: Self-pay

## 2019-12-18 ENCOUNTER — Encounter (HOSPITAL_COMMUNITY)
Admission: AD | Disposition: A | Discharge status: Home / Self Care | Payer: Self-pay | Source: Home / Self Care | Attending: Obstetrics and Gynecology

## 2019-12-18 ENCOUNTER — Inpatient Hospital Stay (HOSPITAL_COMMUNITY): Payer: Medicaid Other | Admitting: Certified Registered Nurse Anesthetist

## 2019-12-18 ENCOUNTER — Inpatient Hospital Stay (HOSPITAL_COMMUNITY)
Admission: AD | Admit: 2019-12-18 | Discharge: 2019-12-21 | DRG: 786 | Disposition: A | Discharge status: Home / Self Care | Payer: Medicaid Other | Attending: Obstetrics and Gynecology | Admitting: Obstetrics and Gynecology

## 2019-12-18 DIAGNOSIS — Z20822 Contact with and (suspected) exposure to covid-19: Secondary | ICD-10-CM | POA: Diagnosis present

## 2019-12-18 DIAGNOSIS — O9081 Anemia of the puerperium: Secondary | ICD-10-CM | POA: Diagnosis not present

## 2019-12-18 DIAGNOSIS — D72819 Decreased white blood cell count, unspecified: Secondary | ICD-10-CM

## 2019-12-18 DIAGNOSIS — Z3A36 36 weeks gestation of pregnancy: Secondary | ICD-10-CM

## 2019-12-18 DIAGNOSIS — O4593 Premature separation of placenta, unspecified, third trimester: Principal | ICD-10-CM

## 2019-12-18 DIAGNOSIS — Z603 Acculturation difficulty: Secondary | ICD-10-CM

## 2019-12-18 DIAGNOSIS — O0990 Supervision of high risk pregnancy, unspecified, unspecified trimester: Secondary | ICD-10-CM

## 2019-12-18 DIAGNOSIS — O09299 Supervision of pregnancy with other poor reproductive or obstetric history, unspecified trimester: Secondary | ICD-10-CM

## 2019-12-18 DIAGNOSIS — D649 Anemia, unspecified: Secondary | ICD-10-CM | POA: Diagnosis not present

## 2019-12-18 DIAGNOSIS — O3433 Maternal care for cervical incompetence, third trimester: Secondary | ICD-10-CM | POA: Diagnosis present

## 2019-12-18 DIAGNOSIS — Z789 Other specified health status: Secondary | ICD-10-CM | POA: Diagnosis present

## 2019-12-18 DIAGNOSIS — Z98891 History of uterine scar from previous surgery: Secondary | ICD-10-CM

## 2019-12-18 DIAGNOSIS — D62 Acute posthemorrhagic anemia: Secondary | ICD-10-CM | POA: Diagnosis not present

## 2019-12-18 DIAGNOSIS — N939 Abnormal uterine and vaginal bleeding, unspecified: Secondary | ICD-10-CM | POA: Diagnosis present

## 2019-12-18 HISTORY — PX: CERVICAL CERCLAGE: SHX1329

## 2019-12-18 LAB — GROUP B STREP BY PCR: Group B strep by PCR: NEGATIVE

## 2019-12-18 LAB — CBC
HCT: 37.3 % (ref 36.0–46.0)
Hemoglobin: 12.6 g/dL (ref 12.0–15.0)
MCH: 31.8 pg (ref 26.0–34.0)
MCHC: 33.8 g/dL (ref 30.0–36.0)
MCV: 94.2 fL (ref 80.0–100.0)
Platelets: 237 10*3/uL (ref 150–400)
RBC: 3.96 MIL/uL (ref 3.87–5.11)
RDW: 13.4 % (ref 11.5–15.5)
WBC: 7.3 10*3/uL (ref 4.0–10.5)
nRBC: 0 % (ref 0.0–0.2)

## 2019-12-18 LAB — SARS CORONAVIRUS 2 BY RT PCR (HOSPITAL ORDER, PERFORMED IN ~~LOC~~ HOSPITAL LAB): SARS Coronavirus 2: NEGATIVE

## 2019-12-18 SURGERY — Surgical Case
Anesthesia: General

## 2019-12-18 MED ORDER — TETANUS-DIPHTH-ACELL PERTUSSIS 5-2.5-18.5 LF-MCG/0.5 IM SUSP: 0.5000 mL | Freq: Once | INTRAMUSCULAR | Status: DC

## 2019-12-18 MED ORDER — MENTHOL 3 MG MT LOZG
1.0000 | LOZENGE | OROMUCOSAL | Status: DC | PRN
  Filled 2019-12-18: qty 9

## 2019-12-18 MED ORDER — OXYTOCIN-SODIUM CHLORIDE 30-0.9 UT/500ML-% IV SOLN: 2.5000 [IU]/h | INTRAVENOUS | Status: AC

## 2019-12-18 MED ORDER — OXYCODONE HCL 5 MG/5ML PO SOLN: 5.0000 mg | Freq: Once | ORAL | Status: DC | PRN

## 2019-12-18 MED ORDER — ONDANSETRON HCL 4 MG/2ML IJ SOLN: 4.0000 mg | Freq: Once | INTRAMUSCULAR | Status: DC | PRN

## 2019-12-18 MED ORDER — TERBUTALINE SULFATE 1 MG/ML IJ SOLN
INTRAMUSCULAR | Status: AC
  Filled 2019-12-18: qty 1

## 2019-12-18 MED ORDER — KETOROLAC TROMETHAMINE 30 MG/ML IJ SOLN
INTRAMUSCULAR | Status: AC
  Filled 2019-12-18: qty 1

## 2019-12-18 MED ORDER — OXYTOCIN-SODIUM CHLORIDE 30-0.9 UT/500ML-% IV SOLN
INTRAVENOUS | Status: DC | PRN
Start: 2019-12-18 — End: 2019-12-18
  Administered 2019-12-18: 30 [IU] via INTRAVENOUS
  Administered 2019-12-18: 41.7 mL via INTRAVENOUS

## 2019-12-18 MED ORDER — SODIUM CHLORIDE 0.9 % IV SOLN
INTRAVENOUS | Status: AC
  Filled 2019-12-18: qty 500

## 2019-12-18 MED ORDER — DIBUCAINE (PERIANAL) 1 % EX OINT: 1.0000 "application " | TOPICAL_OINTMENT | CUTANEOUS | Status: DC | PRN

## 2019-12-18 MED ORDER — SODIUM CHLORIDE 0.9 % IR SOLN
Status: DC | PRN
  Administered 2019-12-18: 1

## 2019-12-18 MED ORDER — STERILE WATER FOR IRRIGATION IR SOLN
Status: DC | PRN
  Administered 2019-12-18: 1

## 2019-12-18 MED ORDER — HYDROMORPHONE HCL 1 MG/ML IJ SOLN
0.2500 mg | INTRAMUSCULAR | Status: DC | PRN
  Administered 2019-12-18: 1 mg via INTRAVENOUS
  Administered 2019-12-18: 0.5 mg via INTRAVENOUS
  Administered 2019-12-18: 1 mg via INTRAVENOUS

## 2019-12-18 MED ORDER — OXYCODONE HCL 5 MG PO TABS: 5.0000 mg | ORAL_TABLET | Freq: Once | ORAL | Status: DC | PRN

## 2019-12-18 MED ORDER — PHENYLEPHRINE HCL (PRESSORS) 10 MG/ML IV SOLN
INTRAVENOUS | Status: DC | PRN
  Administered 2019-12-18 (×2): 80 mg via INTRAVENOUS

## 2019-12-18 MED ORDER — FENTANYL CITRATE (PF) 100 MCG/2ML IJ SOLN
INTRAMUSCULAR | Status: DC | PRN
  Administered 2019-12-18 (×2): 100 ug via INTRAVENOUS
  Administered 2019-12-18: 150 ug via INTRAVENOUS

## 2019-12-18 MED ORDER — MIDAZOLAM HCL 2 MG/2ML IJ SOLN
INTRAMUSCULAR | Status: AC
  Filled 2019-12-18: qty 2

## 2019-12-18 MED ORDER — NALOXONE HCL 0.4 MG/ML IJ SOLN: 0.4000 mg | INTRAMUSCULAR | Status: DC | PRN

## 2019-12-18 MED ORDER — CEFAZOLIN SODIUM-DEXTROSE 2-3 GM-%(50ML) IV SOLR
INTRAVENOUS | Status: DC | PRN
  Administered 2019-12-18: 2 g via INTRAVENOUS

## 2019-12-18 MED ORDER — PROPOFOL 10 MG/ML IV BOLUS
INTRAVENOUS | Status: DC | PRN
  Administered 2019-12-18: 150 mg via INTRAVENOUS

## 2019-12-18 MED ORDER — HYDROMORPHONE HCL 1 MG/ML IJ SOLN
INTRAMUSCULAR | Status: AC
  Filled 2019-12-18: qty 1

## 2019-12-18 MED ORDER — LACTATED RINGERS IV SOLN: INTRAVENOUS | Status: DC

## 2019-12-18 MED ORDER — WITCH HAZEL-GLYCERIN EX PADS: 1.0000 "application " | MEDICATED_PAD | CUTANEOUS | Status: DC | PRN

## 2019-12-18 MED ORDER — SIMETHICONE 80 MG PO CHEW
80.0000 mg | CHEWABLE_TABLET | Freq: Three times a day (TID) | ORAL | Status: DC
  Administered 2019-12-19 – 2019-12-20 (×5): 80 mg via ORAL
  Filled 2019-12-18 (×5): qty 1

## 2019-12-18 MED ORDER — HYDROMORPHONE 1 MG/ML IV SOLN
INTRAVENOUS | Status: DC
  Administered 2019-12-18 (×2): 30 mg via INTRAVENOUS
  Administered 2019-12-19: 0.9 mg via INTRAVENOUS
  Administered 2019-12-19: 1.2 mg via INTRAVENOUS
  Administered 2019-12-19: 1.8 mg via INTRAVENOUS
  Administered 2019-12-19: 0.9 mg via INTRAVENOUS
  Filled 2019-12-18: qty 30

## 2019-12-18 MED ORDER — FENTANYL CITRATE (PF) 250 MCG/5ML IJ SOLN
INTRAMUSCULAR | Status: AC
  Filled 2019-12-18: qty 5

## 2019-12-18 MED ORDER — SODIUM CHLORIDE 0.9% FLUSH: 9.0000 mL | INTRAVENOUS | Status: DC | PRN

## 2019-12-18 MED ORDER — DIPHENHYDRAMINE HCL 50 MG/ML IJ SOLN: 12.5000 mg | Freq: Four times a day (QID) | INTRAMUSCULAR | Status: DC | PRN

## 2019-12-18 MED ORDER — ONDANSETRON HCL 4 MG/2ML IJ SOLN
INTRAMUSCULAR | Status: DC | PRN
  Administered 2019-12-18: 4 mg via INTRAVENOUS

## 2019-12-18 MED ORDER — SENNOSIDES-DOCUSATE SODIUM 8.6-50 MG PO TABS
2.0000 | ORAL_TABLET | Freq: Every evening | ORAL | Status: DC | PRN
  Administered 2019-12-19: 2 via ORAL
  Filled 2019-12-18: qty 2

## 2019-12-18 MED ORDER — HYDROMORPHONE HCL 1 MG/ML IJ SOLN
INTRAMUSCULAR | Status: AC
  Filled 2019-12-18: qty 0.5

## 2019-12-18 MED ORDER — LIDOCAINE HCL (CARDIAC) PF 100 MG/5ML IV SOSY
PREFILLED_SYRINGE | INTRAVENOUS | Status: DC | PRN
  Administered 2019-12-18: 100 mg via INTRAVENOUS

## 2019-12-18 MED ORDER — ONDANSETRON HCL 4 MG/2ML IJ SOLN: 4.0000 mg | Freq: Four times a day (QID) | INTRAMUSCULAR | Status: DC | PRN

## 2019-12-18 MED ORDER — SUCCINYLCHOLINE CHLORIDE 20 MG/ML IJ SOLN
INTRAMUSCULAR | Status: DC | PRN
  Administered 2019-12-18: 80 mg via INTRAVENOUS

## 2019-12-18 MED ORDER — ENOXAPARIN SODIUM 40 MG/0.4ML ~~LOC~~ SOLN
40.0000 mg | SUBCUTANEOUS | Status: DC
  Administered 2019-12-20: 40 mg via SUBCUTANEOUS
  Filled 2019-12-18 (×4): qty 0.4

## 2019-12-18 MED ORDER — OXYTOCIN-SODIUM CHLORIDE 30-0.9 UT/500ML-% IV SOLN: INTRAVENOUS | Status: DC | PRN

## 2019-12-18 MED ORDER — DEXAMETHASONE SODIUM PHOSPHATE 10 MG/ML IJ SOLN
INTRAMUSCULAR | Status: DC | PRN
  Administered 2019-12-18: 4 mg via INTRAVENOUS

## 2019-12-18 MED ORDER — FENTANYL CITRATE (PF) 100 MCG/2ML IJ SOLN
INTRAMUSCULAR | Status: AC
  Filled 2019-12-18: qty 2

## 2019-12-18 MED ORDER — DIPHENHYDRAMINE HCL 12.5 MG/5ML PO ELIX
12.5000 mg | ORAL_SOLUTION | Freq: Four times a day (QID) | ORAL | Status: DC | PRN
  Filled 2019-12-18: qty 5

## 2019-12-18 MED ORDER — KETOROLAC TROMETHAMINE 30 MG/ML IJ SOLN
30.0000 mg | Freq: Once | INTRAMUSCULAR | Status: AC | PRN
  Administered 2019-12-18: 30 mg via INTRAVENOUS

## 2019-12-18 MED ORDER — LACTATED RINGERS IV SOLN: INTRAVENOUS | Status: DC | PRN

## 2019-12-18 MED ORDER — COCONUT OIL OIL: 1.0000 "application " | TOPICAL_OIL | Status: DC | PRN

## 2019-12-18 MED ORDER — DIPHENHYDRAMINE HCL 25 MG PO CAPS: 25.0000 mg | ORAL_CAPSULE | Freq: Four times a day (QID) | ORAL | Status: DC | PRN

## 2019-12-18 MED ORDER — POLYETHYLENE GLYCOL 3350 17 G PO PACK
17.0000 g | PACK | Freq: Every day | ORAL | Status: DC
  Administered 2019-12-20 (×2): 17 g via ORAL
  Filled 2019-12-18: qty 1

## 2019-12-18 SURGICAL SUPPLY — 34 items
BENZOIN TINCTURE PRP APPL 2/3 (GAUZE/BANDAGES/DRESSINGS) ×3 IMPLANT
CANISTER SUCT 3000ML PPV (MISCELLANEOUS) ×3 IMPLANT
CHLORAPREP W/TINT 26ML (MISCELLANEOUS) ×3 IMPLANT
CLOSURE STERI STRIP 1/2 X4 (GAUZE/BANDAGES/DRESSINGS) ×3 IMPLANT
DRSG OPSITE POSTOP 4X10 (GAUZE/BANDAGES/DRESSINGS) ×3 IMPLANT
ELECT REM PT RETURN 9FT ADLT (ELECTROSURGICAL) ×3
ELECTRODE REM PT RTRN 9FT ADLT (ELECTROSURGICAL) ×1 IMPLANT
EXTRACTOR VACUUM KIWI (MISCELLANEOUS) ×3 IMPLANT
GLOVE BIOGEL PI IND STRL 7.0 (GLOVE) ×2 IMPLANT
GLOVE BIOGEL PI IND STRL 7.5 (GLOVE) ×1 IMPLANT
GLOVE BIOGEL PI INDICATOR 7.0 (GLOVE) ×4
GLOVE BIOGEL PI INDICATOR 7.5 (GLOVE) ×2
GLOVE SKINSENSE NS SZ7.0 (GLOVE) ×2
GLOVE SKINSENSE STRL SZ7.0 (GLOVE) ×1 IMPLANT
GOWN STRL REUS W/ TWL LRG LVL3 (GOWN DISPOSABLE) ×2 IMPLANT
GOWN STRL REUS W/ TWL XL LVL3 (GOWN DISPOSABLE) ×1 IMPLANT
GOWN STRL REUS W/TWL LRG LVL3 (GOWN DISPOSABLE) ×4
GOWN STRL REUS W/TWL XL LVL3 (GOWN DISPOSABLE) ×2
NS IRRIG 1000ML POUR BTL (IV SOLUTION) ×3 IMPLANT
PACK C SECTION WH (CUSTOM PROCEDURE TRAY) ×3 IMPLANT
PAD ABD 7.5X8 STRL (GAUZE/BANDAGES/DRESSINGS) ×3 IMPLANT
PAD OB MATERNITY 4.3X12.25 (PERSONAL CARE ITEMS) ×3 IMPLANT
PAD PREP 24X48 CUFFED NSTRL (MISCELLANEOUS) ×3 IMPLANT
PENCIL SMOKE EVAC W/HOLSTER (ELECTROSURGICAL) ×3 IMPLANT
STRIP CLOSURE SKIN 1/2X4 (GAUZE/BANDAGES/DRESSINGS) ×2 IMPLANT
SUT MNCRL 0 VIOLET CTX 36 (SUTURE) ×2 IMPLANT
SUT MON AB 4-0 PS1 27 (SUTURE) ×3 IMPLANT
SUT MONOCRYL 0 CTX 36 (SUTURE) ×4
SUT PLAIN 2 0 XLH (SUTURE) ×3 IMPLANT
SUT VIC AB 0 CT1 36 (SUTURE) ×6 IMPLANT
SUT VIC AB 3-0 CT1 27 (SUTURE) ×2
SUT VIC AB 3-0 CT1 TAPERPNT 27 (SUTURE) ×1 IMPLANT
TOWEL OR 17X24 6PK STRL BLUE (TOWEL DISPOSABLE) ×6 IMPLANT
WATER STERILE IRR 1000ML POUR (IV SOLUTION) ×3 IMPLANT

## 2019-12-18 NOTE — Anesthesia Procedure Notes (Signed)
Procedure Name: Intubation Date/Time: 12/18/2019 11:46 AM Performed by: Orlie Pollen, CRNA Pre-anesthesia Checklist: Patient identified, Emergency Drugs available and Suction available Patient Re-evaluated:Patient Re-evaluated prior to induction Oxygen Delivery Method: Circle System Utilized Preoxygenation: Pre-oxygenation with 100% oxygen Induction Type: IV induction, Rapid sequence and Cricoid Pressure applied Laryngoscope Size: 3 and Glidescope Grade View: Grade II Tube type: Oral Tube size: 7.0 mm Number of attempts: 1 Airway Equipment and Method: stylet Placement Confirmation: ETT inserted through vocal cords under direct vision,  positive ETCO2 and breath sounds checked- equal and bilateral Secured at: 21 cm Tube secured with: Tape Dental Injury: Teeth and Oropharynx as per pre-operative assessment

## 2019-12-18 NOTE — Transfer of Care (Signed)
Immediate Anesthesia Transfer of Care Note  Patient: Kathy Wilkerson  Procedure(s) Performed: CESAREAN SECTION (N/A ) CERCLAGE CERVICAL removal (N/A )  Patient Location: PACU  Anesthesia Type:General  Level of Consciousness: awake, alert  and oriented  Airway & Oxygen Therapy: Patient Spontanous Breathing and Patient connected to nasal cannula oxygen  Post-op Assessment: Report given to RN and Post -op Vital signs reviewed and stable  Post vital signs: Reviewed and stable  Last Vitals:  Vitals Value Taken Time  BP 115/77 12/18/19 1250  Temp    Pulse 101 12/18/19 1256  Resp 21 12/18/19 1256  SpO2 100 % 12/18/19 1256  Vitals shown include unvalidated device data.  Last Pain:  Vitals:   12/18/19 1037  TempSrc: Oral  PainSc:          Complications: No complications documented.

## 2019-12-18 NOTE — Progress Notes (Signed)
[employer] interpreter, Saif [id number], used to introduce self to pt and give instructions to leave urine specimen and undress to evaluate chief complaint.

## 2019-12-18 NOTE — Anesthesia Postprocedure Evaluation (Signed)
Anesthesia Post Note  Patient: Breslin Courtwright  Procedure(s) Performed: CESAREAN SECTION (N/A ) CERCLAGE CERVICAL removal (N/A )     Patient location during evaluation: PACU Anesthesia Type: General Level of consciousness: awake and alert Pain management: pain level controlled Vital Signs Assessment: post-procedure vital signs reviewed and stable Respiratory status: spontaneous breathing, nonlabored ventilation, respiratory function stable and patient connected to nasal cannula oxygen Cardiovascular status: blood pressure returned to baseline and stable Postop Assessment: no apparent nausea or vomiting Anesthetic complications: no   No complications documented.  Last Vitals:  Vitals:   12/18/19 1436 12/18/19 1506  BP:  116/63  Pulse:  95  Resp: 12 14  Temp:  36.6 C  SpO2: 98% 99%    Last Pain:  Vitals:   12/18/19 1506  TempSrc: Oral  PainSc:    Pain Goal: Patients Stated Pain Goal: 0 (12/18/19 1404)              Epidural/Spinal Function Cutaneous sensation: Normal sensation (12/18/19 1505)  Trevor Iha

## 2019-12-18 NOTE — Progress Notes (Signed)
Offered a ministry of presence to FOB during the emergency C-section as well as during the NICU admission. The parents have experienced 2 miscarriages and in 6 years of marriage this is their first living child together.  He was overjoyed to hear his baby's cries and to know that his wife is okay.  FOB has 3 other children (Ages 34 years old, 49 and 36).  His oldest son is in [place] and having to quarantine in a hotel there due to travel restrictions. His youngest daughter turned 29 today and he was happy that he could tell her she shared a birthday with her sister.  The 79 and 61 year olds live in [place] with their mother and he has a good relationship with them.    Chaplain Dyanne Carrel, Bcc Pager, [phone] 4:15 PM    12/18/19 1600  Clinical Encounter Type  Visited With Family  Visit Type Spiritual support

## 2019-12-18 NOTE — MAU Note (Signed)
Pt came in to MAU for bleeding that begun this morning at 0900. Denies recent intercourse. Reports decreased fetal movement and lower abdominal 8/10 pain.

## 2019-12-18 NOTE — Anesthesia Preprocedure Evaluation (Signed)
Anesthesia Evaluation  Patient identified by MRN, date of birth, ID band Patient awake  Preop documentation limited or incomplete due to emergent nature of procedure.  Airway Mallampati: II  TM Distance: >3 FB Neck ROM: Full    Dental no notable dental hx. (+) Teeth Intact, Dental Advisory Given   Pulmonary    Pulmonary exam normal breath sounds clear to auscultation       Cardiovascular Normal cardiovascular exam Rhythm:Regular Rate:Normal     Neuro/Psych    GI/Hepatic   Endo/Other    Renal/GU      Musculoskeletal   Abdominal   Peds  Hematology   Anesthesia Other Findings Very brief hx with video arabic translator  Reproductive/Obstetrics (+) Pregnancy                             Anesthesia Physical Anesthesia Plan  ASA: II and emergent  Anesthesia Plan: General   Post-op Pain Management:    Induction: Intravenous, Cricoid pressure planned and Rapid sequence  PONV Risk Score and Plan: 4 or greater and Treatment may vary due to age or medical condition, Ondansetron and Dexamethasone  Airway Management Planned: Oral ETT  Additional Equipment: None  Intra-op Plan:   Post-operative Plan: Extubation in OR  Informed Consent: I have reviewed the patients History and Physical, chart, labs and discussed the procedure including the risks, benefits and alternatives for the proposed anesthesia with the patient or authorized representative who has indicated his/her understanding and acceptance.     Dental advisory given  Plan Discussed with: CRNA  Anesthesia Plan Comments:         Anesthesia Quick Evaluation

## 2019-12-18 NOTE — Lactation Note (Signed)
This note was copied from a baby's chart. Lactation Consultation Note  Patient Name: Kathy Wilkerson OMVEH'M Date: 12/18/2019 Reason for consult: Initial assessment;1st time breastfeeding;Late-preterm 34-36.6wks P1, 9 hour, NICU  LPTI that is  less than 6 lbs at birth. Mom was eating as LC enter room, declined interpreter services,  husband translated for mom.  Mom is active on the [facility] Program in [place] and [facility] referral sent for [facility] DEBP due to mom not having DEBP at home.  Parents understand LC services will follow up with parents tomorrow.  LC explained and demonstrated how to use DEBP, mom taught back and understands to use DEBP every 3 hours for 15 minutes to help establish her milk supply due infant separation ( infant in NICU)  Parents will follow NICU feeding guidelines regarding milk storage and collection.  Mom shown how to use DEBP & how to disassemble, clean, & reassemble parts. Mom knows to call RN or LC if she has any further questions or concerns. Mom made aware of O/P services, breastfeeding support groups, community resources, and our phone # for post-discharge questions.   Maternal Data Formula Feeding for Exclusion: No  Feeding    LATCH Score                   Interventions Interventions: Breast feeding basics reviewed;Expressed milk;DEBP;Hand pump  Lactation Tools Discussed/Used WIC Program: Yes Pump Review: Setup, frequency, and cleaning;Milk Storage Initiated by:: Danelle Earthly, IBCLC Date initiated:: 12/18/19   Consult Status Consult Status: Follow-up Date: 12/19/19 Follow-up type: In-patient    Danelle Earthly 12/18/2019, 9:18 PM

## 2019-12-18 NOTE — Op Note (Signed)
Operative Note   SURGERY DATE: 12/18/2019  PRE-OP DIAGNOSIS:  *Pregnancy at 35/4 *Fetal bradycardia *Placental abruption *Cerclage in place  POST-OP DIAGNOSIS: Same. Delivered   PROCEDURE: Stat primary low transverse cesarean section via pfannenstiel skin incision with double layer uterine closure and McDonald Cerclage removal  SURGEON: Cornelia Copa MD  ASSISTANT: NOne  ANESTHESIA: general  ESTIMATED BLOOD LOSS: intra operatively. Patient had approximately of vaginal bleeding in OB triage  DRAINS: per anesthesia note  TOTAL IV FLUIDS: per anesthesia notemL crystalloid  VTE PROPHYLAXIS: SCDs to bilateral lower extremities  ANTIBIOTICS: Ancef 2gm IV and Azithromycin 500mg  IV given intraoperatively  SPECIMENS: placenta to pathology  COMPLICATIONS: None  INDICATIONS: Patient presented to [facility] triage with complaints of vaginal bleeding and painful contractions. She was contracting approximately q3-62m and the fetus was category I with accelerations. Cerclage visualized but difficulty with removal due to clots and vaginal bleeding coming from the cervix consistent with a placental abruption. Cerclage knots visualized on anterior cervix and cerclage cut but difficulty with removal because some remained in cervix. On manual exam, she was 2-3cm dilated and 90 dilated and I was able to tease the cerclage off the cervix. During removal, the fetus had bradycardia to the 40s that was confirmed on ultrasound that did not respond to position changes so a stat c-section was called. The fetus was still bradycardic in the OR upon arrival so decision was made to proceed with the stat c-section with general anesthesia.   FINDINGS: No intra-abdominal adhesions were noted. Grossly normal uterus, tubes and ovaries. Clear amniotic fluid, cephalic, female infant, weight 2650gm, APGARs 8/9, intact placenta. The infant was vigorous and crying before she was fully delivered from the uterus.    Cord gases  Results for Kathy Wilkerson, Kathy Wilkerson (MRN Orlando Penner) as of 12/19/2019 12:27  Ref. Range 12/18/2019 12:21 12/18/2019 12:22  pH cord blood (arterial) Latest Ref Range: 7.210 - 7.380  7.253   pCO2 cord blood (arterial) Latest Ref Range: 42.0 - 56.0 mmHg 53.1   Bicarbonate Latest Ref Range: 13.0 - 22.0 mmol/L 22.6 (H) 22.5 (H)  Ph Cord Blood (Venous) Latest Ref Range: 7.240 - 7.380   7.280    PROCEDURE IN DETAIL: The patient was taken to the operating room where fetal bradycardia was still present after moving to OR bed. Patient prepped with betadine and foley placed and patient draped. When the patient's anesthesia was complete a 02/18/2020 skin incision was made with the scalpel and carried through to the underlying layer of fascia and incised at the midline in one motion. The fascia was then spread and separated off the rectus muscles. The rectus muscles were then separated in the midline and the peritoneum was entered bluntly. The bladder blade and the large Richardson retractor were inserted and the vesicouterine peritoneum was identified.  A low transverse hysterotomy was made with the scalpel until the endometrial cavity was breached and the amniotic sac ruptured, yielding clear amniotic fluid. This incision was extended bluntly and the infant's head, shoulders and body were delivered atraumatically.The cord was clamped x 2 and cut, and the infant was handed to the awaiting pediatricians, after delayed cord clamping was done.  The placenta was then easily expressed from the uterus and then the uterus was exteriorized and cleared of all clots and debris. The hysterotomy was repaired with a running suture of 1-0 monocryl. A second imbricating layer of 1-0 monocryl suture was then placed to achieve excellent hemostasis.   The uterus and  adnexa were then returned to the abdomen, and the hysterotomy and all operative sites were reinspected and excellent hemostasis was noted after irrigation and  suction of the abdomen with warm saline.  The peritoneum was closed with a running stitch of 3-0 Vicryl. The fascia was reapproximated with 0 Vicryl in a simple running fashion bilaterally. The subcutaneous layer was then reapproximated with interrupted sutures of 2-0 plain gut, and the skin was then closed with 4-0 monocryl, in a subcuticular fashion.  The patient was "frog legged" and on manual exam the cerclage suture was sitting in the vagina and removed. It was examined and was mersilene tape measuring approximately 5-6cm.   The patient  tolerated the procedure well. Sponge, lap, needle, and instrument counts were correct x 2. The patient was transferred to the recovery room awake, alert and breathing independently in stable condition.  Cornelia Copa MD Attending Center for [facility])

## 2019-12-19 DIAGNOSIS — D649 Anemia, unspecified: Secondary | ICD-10-CM | POA: Diagnosis not present

## 2019-12-19 LAB — CBC
HCT: 25.2 % — ABNORMAL LOW (ref 36.0–46.0)
HCT: 31 % — ABNORMAL LOW (ref 36.0–46.0)
Hemoglobin: 8.5 g/dL — ABNORMAL LOW (ref 12.0–15.0)
Hemoglobin: 10.5 g/dL — ABNORMAL LOW (ref 12.0–15.0)
MCH: 32.7 pg (ref 26.0–34.0)
MCH: 32.1 pg (ref 26.0–34.0)
MCHC: 33.7 g/dL (ref 30.0–36.0)
MCHC: 33.9 g/dL (ref 30.0–36.0)
MCV: 96.9 fL (ref 80.0–100.0)
MCV: 94.8 fL (ref 80.0–100.0)
Platelets: 230 10*3/uL (ref 150–400)
Platelets: 253 10*3/uL (ref 150–400)
RBC: 2.6 MIL/uL — ABNORMAL LOW (ref 3.87–5.11)
RBC: 3.27 MIL/uL — ABNORMAL LOW (ref 3.87–5.11)
RDW: 13.7 % (ref 11.5–15.5)
RDW: 14.1 % (ref 11.5–15.5)
WBC: 6.4 10*3/uL (ref 4.0–10.5)
WBC: 6.8 10*3/uL (ref 4.0–10.5)
nRBC: 0 % (ref 0.0–0.2)
nRBC: 0 % (ref 0.0–0.2)

## 2019-12-19 LAB — CREATININE, SERUM
Creatinine, Ser: 0.42 mg/dL — ABNORMAL LOW (ref 0.44–1.00)
GFR calc Af Amer: >60 mL/min (ref >60)
GFR calc non Af Amer: >60 mL/min (ref >60)

## 2019-12-19 LAB — RPR: RPR Ser Ql: NONREACTIVE

## 2019-12-19 LAB — PREPARE RBC (CROSSMATCH)

## 2019-12-19 MED ORDER — KETOROLAC TROMETHAMINE 30 MG/ML IJ SOLN
30.0000 mg | Freq: Once | INTRAMUSCULAR | Status: AC
  Administered 2019-12-19: 30 mg via INTRAMUSCULAR
  Filled 2019-12-19: qty 1

## 2019-12-19 MED ORDER — ACETAMINOPHEN 500 MG PO TABS
1000.0000 mg | ORAL_TABLET | Freq: Four times a day (QID) | ORAL | Status: DC
  Administered 2019-12-19 – 2019-12-21 (×5): 1000 mg via ORAL
  Filled 2019-12-19 (×5): qty 2

## 2019-12-19 MED ORDER — SODIUM CHLORIDE 0.9% IV SOLUTION: Freq: Once | INTRAVENOUS | Status: DC

## 2019-12-19 MED ORDER — OXYCODONE HCL 5 MG PO TABS
5.0000 mg | ORAL_TABLET | ORAL | Status: DC | PRN
  Administered 2019-12-19: 10 mg via ORAL
  Administered 2019-12-20: 5 mg via ORAL
  Administered 2019-12-20: 10 mg via ORAL
  Filled 2019-12-19: qty 2
  Filled 2019-12-19: qty 1
  Filled 2019-12-19: qty 2

## 2019-12-19 MED ORDER — HYDROMORPHONE HCL 1 MG/ML IJ SOLN: 0.2000 mg | INTRAMUSCULAR | Status: DC | PRN

## 2019-12-19 MED ORDER — IBUPROFEN 600 MG PO TABS
600.0000 mg | ORAL_TABLET | Freq: Four times a day (QID) | ORAL | Status: DC | PRN
  Administered 2019-12-20 – 2019-12-21 (×3): 600 mg via ORAL
  Filled 2019-12-19 (×3): qty 1

## 2019-12-19 NOTE — Progress Notes (Addendum)
POSTPARTUM PROGRESS NOTE  Subjective: Lenice Koper is a 34 y.o. G3P0110 POD#1 s/p stat pLTCS at [redacted]w[redacted]d.  No acute events overnight, however she is not ambulating without assistance secondary to pain and fatigue/weakness. Per her husband, she is eating, but not a lot. She does feel dizzy as well. Foley catheter is still in place. Denies nausea or vomiting. She has not passed flatus. Pain is poorly controlled.  Lochia is appropriate.  Objective: Blood pressure 97/77, pulse 93, temperature 98.9 F (37.2 C), temperature source Oral, resp. rate 20, height 5\' 3"  (1.6 m), weight 61 kg, last menstrual period 04/13/2019, SpO2 98 %.  Physical Exam:  General: alert, cooperative and no distress Chest: no respiratory distress Abdomen: soft, moderately tender  Uterine Fundus: firm, moderately tender Extremities: No calf swelling or tenderness  No edema  Recent Labs    12/18/19 1146 12/19/19 0642  HGB 12.6 8.5*  HCT 37.3 25.2*    Assessment/Plan: Andrienne Schreurs is a 34 y.o. G3P0110 POD#1 s/p stat LTCS at [redacted]w[redacted]d.  Routine Postpartum Care: Doing fairly well. Pain medications adjusted as her pain is not well controlled.  -- Continue routine care, lactation support  -- Contraception: IUD at postpartum visit -- Feeding: Breast and Bottle -- Symptomatic Anemia: will give 1U pRBCs, check CBC 4 hours after infusion.  Dispo: Plan for discharge on POD#3.  [redacted]w[redacted]d, DO PGY1 Family Medicine Resident  GME ATTESTATION:  I saw and evaluated the patient. I agree with the findings and the plan of care as documented in the resident's note.  Evelena Leyden, DO OB Fellow, Faculty [facility], Center for [facility] 12/19/2019 3:28 PM

## 2019-12-19 NOTE — Progress Notes (Signed)
RN able to obtain orthostatics and assist in moving patient to wheelchair. Patient is still extremely weak and having difficulty standing. She states that she has eaten some fruit today but not much else. Per patient request, mom was wheeled to bathroom to attempt to have a bowel movement stating that she "was having pain in her bowels." RN stayed with patient and assisted in hygiene care while providing emotional supprt. With encouragement, mom was able to walk back to bed from bathroom with assitance. RN  educated mom that ambulation will help reduce gas pain. RN educated patient on pain management and made her aware that she has medication available. Patient refused medication at this time.

## 2019-12-19 NOTE — H&P (Signed)
Obstetrics Admission History & Physical  12/18/2019 - 1:17 PM Primary OBGYN: [facility]  Chief Complaint: abdominal pain, vaginal bleeding, uterine contractons  History of Present Illness  34 y.o. [id number] @ [redacted]w[redacted]d, with the above CC. Pregnancy complicated by: ppx mcdonald cerclage in place.  Ms. Kathy Wilkerson presented with the above CC. Fetus was category I with accels and she was having regular uterine contractions, with VB on exam consistent with abruption bleeding. Difficulty with removing the cerclage and she had fetal bradycardia that necessitated a stat c-section, which was uncomplicated (see op note).   Review of Systems:as noted in the History of Present Illness.  Patient Active Problem List   Diagnosis Date Noted  . Status post cesarean delivery 12/18/2019  . Vaginal bleeding 11/25/2019  . Vaginal bleeding in pregnancy, second trimester 10/24/2019  . Leukopenia 08/03/2019  . Cervical cerclage suture present in third trimester 07/31/2019  . Supervision of high risk pregnancy, antepartum 06/02/2019  . History of incompetent cervix, currently pregnant 10/06/2018  . Previous preterm delivery, antepartum 08/21/2018  . Language barrier 08/21/2018     PMHx:  Past Medical History:  Diagnosis Date  . Medical history non-contributory   . Preterm labor    PSHx:  Past Surgical History:  Procedure Laterality Date  . CERVICAL CERCLAGE N/A 07/10/2019   Procedure: CERCLAGE CERVICAL;  Surgeon: Tereso Newcomer, MD;  Location: [facility] LD ORS;  Service: Gynecology;  Laterality: N/A;   Medications:  Medications Prior to Admission  Medication Sig Dispense Refill Last Dose  . ferrous sulfate 325 (65 FE) MG tablet Take 1 tablet (325 mg total) by mouth 3 (three) times daily with meals. 100 tablet 3 12/17/2019 at Unknown time  . HYDROXYprogesterone caproate (MAKENA) autoinjector Inject 275 mg into the skin every 7 (seven) days.   12/17/2019 at Unknown time  .  prenatal vitamin w/FE, FA (PRENATAL 1 + 1) 27-1 MG TABS tablet Take 1 tablet by mouth daily at 12 noon. 30 tablet 11 12/17/2019 at Unknown time  . NIFEdipine (PROCARDIA) 10 MG capsule Take 1 capsule (10 mg total) by mouth every 20 (twenty) minutes as needed (preterm contractions). 30 capsule 2      Allergies: has No Known Allergies. OBHx:  OB History  Gravida Para Term Preterm AB Living  3 1   1 1  0  SAB TAB Ectopic Multiple Live Births  1       1    # Outcome Date GA Lbr Len/2nd Weight Sex Delivery Anes PTL Lv  3 Current           2 SAB 10/06/18 [redacted]w[redacted]d   F  None  FD     Birth Comments: Placenta delivered around 1030, large retroplacental clots, EBL 800 ml     Complications: Other Excessive Bleeding  1 Preterm 11/05/17 [redacted]w[redacted]d  2000 g F Vag-Spont  N ND     Birth Comments: bleeding          FHx:  Family History  Problem Relation Age of Onset  . Healthy Mother   . Diabetes Father   . Heart disease Father   . Hypertension Father    Soc Hx:  Social History   Socioeconomic History  . Marital status: Married    Spouse name: [redacted]w[redacted]d  . Number of children: Not on file  . Years of education: Not on file  . Highest education level: Bachelor's degree (e.g., BA, AB, BS)  Occupational History  . Not on file  Tobacco Use  . Smoking status: Never Smoker  . Smokeless tobacco: Never Used  Vaping Use  . Vaping Use: Never used  Substance and Sexual Activity  . Alcohol use: Never  . Drug use: Never  . Sexual activity: Yes    Birth control/protection: None  Other Topics Concern  . Not on file  Social History Narrative    N.A   Social Determinants of Health   Financial Resource Strain:   . Difficulty of Paying Living Expenses:   Food Insecurity: No Food Insecurity  . Worried About Programme researcher, broadcasting/film/video in the Last Year: Never true  . Ran Out of Food in the Last Year: Never true  Transportation Needs: No Transportation Needs  . Lack of Transportation (Medical): No  .  Lack of Transportation (Non-Medical): No  Physical Activity:   . Days of Exercise per Week:   . Minutes of Exercise per Session:   Stress:   . Feeling of Stress :   Social Connections:   . Frequency of Communication with Friends and Family:   . Frequency of Social Gatherings with Friends and Family:   . Attends Religious Services:   . Active Member of Clubs or Organizations:   . Attends Banker Meetings:   Marland Kitchen Marital Status:   Intimate Partner Violence:   . Fear of Current or Ex-Partner:   . Emotionally Abused:   Marland Kitchen Physically Abused:   . Sexually Abused:     Objective     Current Vital Signs 24h Vital Sign Ranges  T 98.5 F (36.9 C) Temp  Avg: 98.1 F (36.7 C)  Min: 97.7 F (36.5 C)  Max: 98.6 F (37 C)  BP 102/60 BP  Min: 99/65  Max: 118/65  HR 90 Pulse  Avg: 93.1  Min: 77  Max: 104  RR 20 Resp  Avg: 16.9  Min: 12  Max: 21  SaO2 98 % Room Air SpO2  Avg: 98.8 %  Min: 98 %  Max: 100 %       24 Hour I/O Current Shift I/O  Time Ins Outs 07/01 0701 - 07/02 0700 In: 2061.8 [I.V.:2061.8] Out: 3230 [Urine:2800] 07/02 0701 - 07/02 1900 In: -  Out: 450 [Urine:450]    General: Well nourished, well developed female in moderate distress Skin:  Warm and dry.  Respiratory:  Normal respiratory effort Abdomen: gravid, nttp Neuro/Psych:  Normal mood and affect.    Labs  None  Radiology None   Assessment & Plan   34 y.o. G3P0110 @ [redacted]w[redacted]d with abruption, fetal bradycardia Pt for stat c-section.   Cornelia Copa MD Attending Center for [facility])

## 2019-12-19 NOTE — Progress Notes (Signed)
Patient screened out for psychosocial assessment since none of the following apply:  Psychosocial stressors documented in mother or baby's chart  Gestation less than 32 weeks  Code at delivery   Infant with anomalies Please contact the Clinical Social Worker if specific needs arise, by MOB's request, or if MOB scores greater than 9/yes to question 10 on Edinburgh Postpartum Depression Screen.  CSW will continue to monitor infant's CDS due to suspected placental abruption.  Blaine Hamper, MSW, LCSW Clinical Social Work [phone]

## 2019-12-19 NOTE — Lactation Note (Addendum)
This note was copied from a baby's chart. Lactation Consultation Note Baby 34 hrs old. Baby isn't latching to the breast. Mom has been very sick feeling. Very weak. Mom recently received a unit of blood. Mom has been having a lot of dizziness. Hopefully mom will feel better tomorrow. Asked mom to call for latching assistance when baby is cueing. Baby had cued earlier but stopped. Baby sleeping soundly. LC attempted stimulation but baby had no interest at this time.  Baby has feeding tube for nutrition at this time until baby feeding well at breast. Mom has DEBP in room. Has only pumped once. Mom doesn't feel well, felt to weak to pump.  Patient Name: Kathy Wilkerson [id number] Date: 12/19/2019 Reason for consult: Follow-up assessment;Infant < 6lbs;NICU baby;Late-preterm 34-36.6wks   Maternal Data    Feeding Feeding Type: Formula  LATCH Score Latch: Too sleepy or reluctant, no latch achieved, no sucking elicited.  Audible Swallowing: None  Type of Nipple: Everted at rest and after stimulation  Comfort (Breast/Nipple): Engorged, cracked, bleeding, large blisters, severe discomfort  Hold (Positioning): Assistance needed to correctly position infant at breast and maintain latch.  LATCH Score: 3  Interventions    Lactation Tools Discussed/Used WIC Program: No Pump Review: Setup, frequency, and cleaning Initiated by:: RN   Consult Status Consult Status: Follow-up Date: 12/20/19 Follow-up type: In-patient    Kathy Wilkerson 12/19/2019, 10:04 PM

## 2019-12-19 NOTE — Progress Notes (Signed)
Pt c/o lightheaded and dizziness when dangling. MD aware. Will leave foley in until ambulating well via MD orders.

## 2019-12-20 ENCOUNTER — Encounter (HOSPITAL_COMMUNITY): Payer: Self-pay | Admitting: Obstetrics and Gynecology

## 2019-12-20 LAB — BPAM RBC
Blood Product Expiration Date: 202107312359
ISSUE DATE / TIME: 202107021557
Unit Type and Rh: 6200

## 2019-12-20 LAB — TYPE AND SCREEN
ABO/RH(D): A POS
Antibody Screen: NEGATIVE
Unit division: 0

## 2019-12-20 NOTE — Lactation Note (Signed)
This note was copied from a baby's chart. Lactation Consultation Note Baby 75 hrs old. FOB requested RN to give baby to mom to try to latch. LC was available to assist and there.  Baby has no interest in feeding at this time.  Mom has large tubular breast that are filling. Nipples flat and compressible at this time. Encouraged mom to pump every 3 hrs. Mom has only pumped 1 time today. Discussed importance of pumping to build and maintain milk supply.  FOB interprets for mom.  Patient Name: Kathy Wilkerson [id number]'[id number] Date: 12/20/2019 Reason for consult: Follow-up assessment;Primapara;NICU baby;Late-preterm 34-36.6wks   Maternal Data    Feeding Feeding Type: Formula  LATCH Score Latch: Too sleepy or reluctant, no latch achieved, no sucking elicited.  Audible Swallowing: None  Type of Nipple: Flat  Comfort (Breast/Nipple): Filling, red/small blisters or bruises, mild/mod discomfort  Hold (Positioning): Assistance needed to correctly position infant at breast and maintain latch.  LATCH Score: 3  Interventions Interventions: Support pillows;Assisted with latch;Position options;Skin to skin;Breast massage;Breast compression;Adjust position  Lactation Tools Discussed/Used     Consult Status Consult Status: Follow-up Date: 12/20/19 Follow-up type: In-patient    Charyl Dancer 12/20/2019, 9:19 PM

## 2019-12-20 NOTE — Lactation Note (Signed)
This note was copied from a baby's chart. Lactation Consultation Note  Patient Name: Kathy Wilkerson HMCNO'B Date: 12/20/2019 Reason for consult: Follow-up assessment;NICU baby;Late-preterm 34-36.6wks  1136 - 1156 - I followed up with Kathy Wilkerson. She was finishing up with pumping her right breast upon entry. I noted about 5 mls of EBM in her container. I praised her for pumping.  I recommended that she pump both breasts at the same time 8 times a day or every 2-3 hours during the day and every 3-4 hours during the night.   I cleaned her pump equipment and helped her label her EBM and transfer it into a colostrum container.   RN entered room to help Kathy Wilkerson place baby Layan STS while she received gavage feeding. She placed baby near breast to practice nuzzling.  LC to follow up to offer further support for lactation and latching baby as baby continues to progress and show readiness.  Interventions Interventions: Breast feeding basics reviewed;DEBP  Lactation Tools Discussed/Used Tools: Pump;Flanges Flange Size: 27 Breast pump type: Double-Electric Breast Pump Pump Review: Setup, frequency, and cleaning   Consult Status Consult Status: Follow-up Date: 12/21/19 Follow-up type: In-patient    Walker Shadow 12/20/2019, 12:30 PM

## 2019-12-20 NOTE — Progress Notes (Addendum)
Post Partum Day 2 - Arabic translation using Google Translate app on patient's cell phone per patient request. Patient declined use of [employer] Manufacturing engineer Phone Service  Subjective: Kathy Wilkerson  is a 34 y.o. [phone] s/p primary C/S. at [redacted]w[redacted]d.  She reports she is doing well. No acute events overnight. She denies any problems with ambulating, voiding or po intake. Denies nausea or vomiting.  Pain is well controlled on ibuprofen.  Lochia is moderate and improving.   Objective: Blood pressure 123/79, pulse 82, temperature 98 F (36.7 C), temperature source Oral, resp. rate 18, height 5\' 3"  (1.6 m), weight 61 kg, last menstrual period 04/13/2019, SpO2 100 %.  Physical Exam:  General: alert, cooperative and no distress Lochia: appropriate Uterine Fundus: firm Incision: healing well, no significant drainage, no dehiscence, no significant erythema DVT Evaluation: No evidence of DVT seen on physical exam. Negative Homan's sign. No cords or calf tenderness. No significant calf/ankle edema.  Recent Labs    12/19/19 0642 12/19/19 2309  HGB 8.5* 10.5*  HCT 25.2* 31.0*    Assessment/Plan: Plan for discharge tomorrow and Lactation consult - baby in NICU   LOS: 2 days   02/19/20, MSN, CNM 12/20/2019, 4:38 PM

## 2019-12-20 NOTE — Progress Notes (Signed)
Pt shows progression in being able to ambulate in room without use of wheelchair. Still requiring RN for stand by assist. Pt is tearful about gas pain and cramping. RN provided emotional support and assisted pt in walking to the bathroom, noticing patient begin to pass flatulus. Pt had a bowel movement this morning and agreed to take pain medication after rating pain a 7/10.

## 2019-12-21 ENCOUNTER — Ambulatory Visit: Payer: Self-pay

## 2019-12-21 MED ORDER — SIMETHICONE 80 MG PO CHEW: 80.0000 mg | CHEWABLE_TABLET | Freq: Three times a day (TID) | ORAL | 0 refills | Status: DC

## 2019-12-21 MED ORDER — POLYETHYLENE GLYCOL 3350 17 G PO PACK: 17.0000 g | PACK | Freq: Every day | ORAL | 0 refills | Status: DC

## 2019-12-21 MED ORDER — SENNOSIDES-DOCUSATE SODIUM 8.6-50 MG PO TABS: 2.0000 | ORAL_TABLET | Freq: Every evening | ORAL | 0 refills | Status: DC | PRN

## 2019-12-21 MED ORDER — OXYCODONE HCL 5 MG PO TABS: 5.0000 mg | ORAL_TABLET | ORAL | 0 refills | Status: AC | PRN

## 2019-12-21 MED ORDER — IBUPROFEN 600 MG PO TABS: 600.0000 mg | ORAL_TABLET | Freq: Four times a day (QID) | ORAL | 0 refills | Status: DC | PRN

## 2019-12-21 NOTE — Lactation Note (Signed)
This note was copied from a baby's chart. Lactation Consultation Note  Patient Name: Kathy Wilkerson HYWVP'X Date: 12/21/2019 Reason for consult: Follow-up assessment;Mother's request;Late-preterm 34-36.6wks;Infant < 6lbs Baby 72hrs old, wt loss 7.55%, called into the room per request of M. Anne Hahn, Charity fundraiser, states baby due for feeding, with cues, mom requests help with latching baby to breast, will determine tube/gavage feeding amount based on length of time at the breast. Baby up to right breast skin to skin, with mild cues, attempted to latch in cross cradle, baby opens mouth but unable to latch/ suck at breast. Latch attempt ~35mins, baby licked and learned ~44mins. Baby with hiccups and drifting off to sleep, encouraged mom to hold baby skin to skin until RN returns to complete feeding. Updated charge RN of this feeding, baby ready for tube/gavage feeding, advised LC will return with [facility] loaner pump if mom desires. BGilliam, RN, IBCLC  Maternal Data    Feeding Feeding Type: Breast Fed  LATCH Score Latch: Too sleepy or reluctant, no latch achieved, no sucking elicited.  Audible Swallowing: None  Type of Nipple: Everted at rest and after stimulation  Comfort (Breast/Nipple): Filling, red/small blisters or bruises, mild/mod discomfort  Hold (Positioning): Assistance needed to correctly position infant at breast and maintain latch.  LATCH Score: 4  Interventions    Lactation Tools Discussed/Used WIC Program: Yes   Consult Status Consult Status: PRN Date: 12/21/19 Follow-up type: In-patient    Kathy Wilkerson 12/21/2019, 12:19 PM

## 2019-12-21 NOTE — Discharge Summary (Signed)
Postpartum Discharge Summary     Patient Name: Kathy Wilkerson DOB: 1985-11-20 MRN: [id number]  Date of admission: 12/18/2019 Delivery date:12/18/2019  Delivering provider: Aletha Halim  Date of discharge: 12/21/2019  Admitting diagnosis: Status post cesarean delivery [Z98.891] Intrauterine pregnancy: [redacted]w[redacted]d    Secondary diagnosis:  Active Problems:   Language barrier   Cervical cerclage suture present in third trimester   Leukopenia   Vaginal bleeding   Status post cesarean delivery   Symptomatic anemia  Additional problems:    Discharge diagnosis: Term Pregnancy Delivered                                              Post partum procedures:N/A Augmentation: N/A Complications: Fetal bradycardia, emergency cesarean section  Hospital course: Unplanned C/S   34y.o. yo G3P0110 at 351w0das admitted in Active Labor on 12/18/2019. The patient went for cesarean section due to Non-Reassuring FHR. Delivery details as follows: Membrane Rupture Time/Date: 11:47 AM ,12/18/2019   Delivery Method:C-Section, Low Transverse  Details of operation can be found in separate operative note. Patient had an uncomplicated postpartum course.  She is not ambulating,tolerating a regular diet, intermittently passing flatus, and urinating well.  Patient is discharged home in stable condition 12/21/19.  Newborn Data: Birth date:12/18/2019  Birth time:11:47 AM  Gender:Female  Living status:Living  Apgars:8 ,9  Weight:   Magnesium Sulfate received: No BMZ received: No Rhophylac:N/A MMR:N/A T-DaP:Given prenatally Flu: No Given prenatally Transfusion:No  Physical exam  Vitals:   12/19/19 2151 12/20/19 0520 12/20/19 0932 12/21/19 0505  BP: 104/71 109/67 123/79 101/76  Pulse: 92 85 82 78  Resp: _0 Temp: 98.5 F (36.9 C) 98.2 F (36.8 C) 98 F (36.7 C) 98 F (36.7 C)  TempSrc: Oral Oral Oral Oral  SpO2:   100% 99%  Weight:      Height:       General: alert, cooperative and no  distress Lochia: appropriate Uterine Fundus: firm Incision: Honeycomb dressing is clean, dry, and intact DVT Evaluation: No evidence of DVT seen on physical exam. Labs: Lab Results  Component Value Date   WBC 6.8 12/19/2019   HGB 10.5 (L) 12/19/2019   HCT 31.0 (L) 12/19/2019   MCV 94.8 12/19/2019   PLT 253 12/19/2019   CMP Latest Ref Rng & Units 12/19/2019  Creatinine 0.44 - 1.00 mg/dL 0.42(L)   EdFlavia Shippercore: Edinburgh Postnatal Depression Scale Screening Tool 12/19/2019  I have been able to laugh and see the funny side of things. 0  I have looked forward with enjoyment to things. 1  I have blamed myself unnecessarily when things went wrong. 0  I have been anxious or worried for no good reason. 1  I have felt scared or panicky for no good reason. 0  Things have been getting on top of me. 1  I have been so unhappy that I have had difficulty sleeping. 0  I have felt sad or miserable. 0  I have been so unhappy that I have been crying. 0  The thought of harming myself has occurred to me. 0  Edinburgh Postnatal Depression Scale Total 3     After visit meds:  Allergies as of 12/21/2019   No Known Allergies     Medication List    STOP taking these medications   ferrous sulfate 325 (65  FE) MG tablet   HYDROXYprogesterone caproate autoinjector Commonly known as: Makena   NIFEdipine 10 MG capsule Commonly known as: PROCARDIA   prenatal vitamin w/FE, FA 27-1 MG Tabs tablet     TAKE these medications   ibuprofen 600 MG tablet Commonly known as: ADVIL Take 1 tablet (600 mg total) by mouth every 6 (six) hours as needed (for mild pain not relieved by other medications.).   oxyCODONE 5 MG immediate release tablet Commonly known as: Oxy IR/ROXICODONE Take 1 tablet (5 mg total) by mouth every 4 (four) hours as needed for up to 3 days for moderate pain.   polyethylene glycol 17 g packet Commonly known as: MIRALAX / GLYCOLAX Take 17 g by mouth daily.   senna-docusate 8.6-50  MG tablet Commonly known as: Senokot-S Take 2 tablets by mouth at bedtime as needed for mild constipation.   simethicone 80 MG chewable tablet Commonly known as: MYLICON Chew 1 tablet (80 mg total) by mouth 3 (three) times daily after meals.        Discharge home in stable condition Infant Feeding: Bottle Infant Disposition:NICU Discharge instruction: per After Visit Summary and Postpartum booklet. Activity: Advance as tolerated. Pelvic rest for 6 weeks.  Diet: routine diet Future Appointments: Future Appointments  Date Time Provider [place]  01/02/2020  9:55 AM Sloan Leiter, MD [facility]  01/08/2020  9:55 AM Sloan Leiter, MD [facility]  01/15/2020  9:55 AM Guss Bunde, MD [facility]  01/22/2020  9:55 AM Gavin Pound, CNM [facility]   Follow up Visit:  [place] for [facility] at [facility]. Schedule an appointment as soon as possible for a visit in 2 day(s).   Specialty: Obstetrics and Gynecology Why: Please make an appointment for an incision check in two weeks Contact information: [place] [phone]               Please schedule this patient for Incision check in two weeks, In person postpartum visit in 4 weeks with the following provider: female . Additional Postpartum F/U:Postpartum Depression checkup  High risk pregnancy  Delivery mode:  C-Section, Low Transverse  Anticipated Birth Control:  IUD (outpatient)  Oceanographer utilized for all patient interaction, per patient preference  Mallie Snooks, MSN, CNM Certified Nurse Midwife, [employer] for [employer], [place] [facility] 12/21/19 6:17 AM

## 2019-12-21 NOTE — Lactation Note (Addendum)
This note was copied from a baby's chart. Lactation Consultation Note  Patient Name: Kathy Wilkerson LFYBO'F Date: 12/21/2019 Reason for consult: Follow-up assessment;Infant < 6lbs;Late-preterm 31-36.6wks Baby 71hrs old, wt loss  7.55%. Baby asleep swaddled in bassinet, mom standing at side. Mom speaks Arabic, interpreter mobile video unit down at this time (tech called for repair). Communicated via translation on mom's phone. Mom reports baby latched first time yesterday, fed for . Reports plans to breast and formula feed baby, does not have DEBP at home, has WIC [place] Discussed WIC loaner pump, fee $30, mom to discuss with husband upon his arrival to the hospital. [facility] referral sent. Discussed pumping after every feeding at the breast, offer volume back to baby, mom currently pumping 84ml per session. Will f/u with mom regarding [facility] loaner pump before discharge. BGilliam, RN, IBCLC  Maternal Data    Feeding Feeding Type: Formula  LATCH Score                   Interventions    Lactation Tools Discussed/Used WIC Program: Yes   Consult Status Consult Status: Complete Date: 12/21/19    Kathy Wilkerson 12/21/2019, 12:07 PM

## 2019-12-21 NOTE — Lactation Note (Signed)
This note was copied from a baby's chart. Lactation Consultation Note  Patient Name: Kathy Wilkerson WIOMB'T Date: 12/21/2019 Reason for consult: Follow-up assessment;Mother's request;Late-preterm 34-36.6wks;Infant < 6lbs Mom and dad present in room, [facility] loaner pump given #[id number], $30 collected. Mom and dad aware loaner pump to be returned by 12/31/19 and to notify RN or LC if no response from [facility] regarding obtaining a pump. BGilliam, RN, IBCLC  Maternal Data    Feeding  LATCH Score  Interventions    Lactation Tools Discussed/Used WIC Program: Yes   Consult Status Consult Status: PRN Date: 12/21/19 Follow-up type: In-patient    [name] 12/21/2019, 1:45 PM

## 2019-12-21 NOTE — Discharge Instructions (Signed)
Cesarean Delivery, Care After This sheet gives you information about how to care for yourself after your procedure. Your health care provider may also give you more specific instructions. If you have problems or questions, contact your health care provider. What can I expect after the procedure? After the procedure, it is common to have:  A small amount of blood or clear fluid coming from the incision.  Some redness, swelling, and pain in your incision area.  Some abdominal pain and soreness.  Vaginal bleeding (lochia). Even though you did not have a vaginal delivery, you will still have vaginal bleeding and discharge.  Pelvic cramps.  Fatigue. You may have pain, swelling, and discomfort in the tissue between your vagina and your anus (perineum) if:  Your C-section was unplanned, and you were allowed to labor and push.  An incision was made in the area (episiotomy) or the tissue tore during attempted vaginal delivery. Follow these instructions at home: Incision care   Follow instructions from your health care provider about how to take care of your incision. Make sure you: ? Wash your hands with soap and water before you change your bandage (dressing). If soap and water are not available, use hand sanitizer. ? If you have a dressing, change it or remove it as told by your health care provider. ? Leave stitches (sutures), skin staples, skin glue, or adhesive strips in place. These skin closures may need to stay in place for 2 weeks or longer. If adhesive strip edges start to loosen and curl up, you may trim the loose edges. Do not remove adhesive strips completely unless your health care provider tells you to do that.  Check your incision area every day for signs of infection. Check for: ? More redness, swelling, or pain. ? More fluid or blood. ? Warmth. ? Pus or a bad smell.  Do not take baths, swim, or use a hot tub until your health care provider says it's okay. Ask your health  care provider if you can take showers.  When you cough or sneeze, hug a pillow. This helps with pain and decreases the chance of your incision opening up (dehiscing). Do this until your incision heals. Medicines  Take over-the-counter and prescription medicines only as told by your health care provider.  If you were prescribed an antibiotic medicine, take it as told by your health care provider. Do not stop taking the antibiotic even if you start to feel better.  Do not drive or use heavy machinery while taking prescription pain medicine. Lifestyle  Do not drink alcohol. This is especially important if you are breastfeeding or taking pain medicine.  Do not use any products that contain nicotine or tobacco, such as cigarettes, e-cigarettes, and chewing tobacco. If you need help quitting, ask your health care provider. Eating and drinking  Drink at least 8 eight-ounce glasses of water every day unless told not to by your health care provider. If you breastfeed, you may need to drink even more water.  Eat high-fiber foods every day. These foods may help prevent or relieve constipation. High-fiber foods include: ? Whole grain cereals and breads. ? Brown rice. ? Beans. ? Fresh fruits and vegetables. Activity   If possible, have someone help you care for your baby and help with household activities for at least a few days after you leave the hospital.  Return to your normal activities as told by your health care provider. Ask your health care provider what activities are safe for   you.  Rest as much as possible. Try to rest or take a nap while your baby is sleeping.  Do not lift anything that is heavier than 10 lbs (4.5 kg), or the limit that you were told, until your health care provider says that it is safe.  Talk with your health care provider about when you can engage in sexual activity. This may depend on your: ? Risk of infection. ? How fast you heal. ? Comfort and desire to  engage in sexual activity. General instructions  Do not use tampons or douches until your health care provider approves.  Wear loose, comfortable clothing and a supportive and well-fitting bra.  Keep your perineum clean and dry. Wipe from front to back when you use the toilet.  If you pass a blood clot, save it and call your health care provider to discuss. Do not flush blood clots down the toilet before you get instructions from your health care provider.  Keep all follow-up visits for you and your baby as told by your health care provider. This is important. Contact a health care provider if:  You have: ? A fever. ? Bad-smelling vaginal discharge. ? Pus or a bad smell coming from your incision. ? Difficulty or pain when urinating. ? A sudden increase or decrease in the frequency of your bowel movements. ? More redness, swelling, or pain around your incision. ? More fluid or blood coming from your incision. ? A rash. ? Nausea. ? Little or no interest in activities you used to enjoy. ? Questions about caring for yourself or your baby.  Your incision feels warm to the touch.  Your breasts turn red or become painful or hard.  You feel unusually sad or worried.  You vomit.  You pass a blood clot from your vagina.  You urinate more than usual.  You are dizzy or light-headed. Get help right away if:  You have: ? Pain that does not go away or get better with medicine. ? Chest pain. ? Difficulty breathing. ? Blurred vision or spots in your vision. ? Thoughts about hurting yourself or your baby. ? New pain in your abdomen or in one of your legs. ? A severe headache.  You faint.  You bleed from your vagina so much that you fill more than one sanitary pad in one hour. Bleeding should not be heavier than your heaviest period. Summary  After the procedure, it is common to have pain at your incision site, abdominal cramping, and slight bleeding from your vagina.  Check  your incision area every day for signs of infection.  Tell your health care provider about any unusual symptoms.  Keep all follow-up visits for you and your baby as told by your health care provider. This information is not intended to replace advice given to you by your health care provider. Make sure you discuss any questions you have with your health care provider. Document Revised: 12/12/2017 Document Reviewed: 12/12/2017 Elsevier Patient Education  2020 Elsevier Inc.  

## 2019-12-23 LAB — SURGICAL PATHOLOGY

## 2019-12-29 ENCOUNTER — Other Ambulatory Visit: Payer: Self-pay

## 2019-12-29 ENCOUNTER — Ambulatory Visit (INDEPENDENT_AMBULATORY_CARE_PROVIDER_SITE_OTHER): Payer: Medicaid Other | Admitting: *Deleted

## 2019-12-29 VITALS — BP 111/75 | HR 76 | Ht 63.0 in | Wt 121.0 lb

## 2019-12-29 DIAGNOSIS — Z98891 History of uterine scar from previous surgery: Secondary | ICD-10-CM

## 2019-12-29 DIAGNOSIS — Z5189 Encounter for other specified aftercare: Secondary | ICD-10-CM

## 2019-12-29 DIAGNOSIS — Z0130 Encounter for examination of blood pressure without abnormal findings: Secondary | ICD-10-CM

## 2019-12-29 NOTE — Progress Notes (Signed)
Here for nurse visit for incision check and bp check .  Had c/s 12/18/19.BP wnl.  Incision CDI with 5 steristrips. Removed steristrips gently. Noted scant amount old bloody discharge; no redness,no other discharge, no edema. Advised pt to keep wound CDI and return for postpartum appointment. She voices understanding. Roy Tokarz,RN

## 2020-01-02 ENCOUNTER — Encounter: Payer: Medicaid Other | Admitting: Obstetrics and Gynecology

## 2020-01-02 ENCOUNTER — Ambulatory Visit: Payer: Self-pay

## 2020-01-02 NOTE — Lactation Note (Signed)
This note was copied from a baby's chart. Lactation Consultation Note  Patient Name: Kathy Wilkerson CNOBS'J Date: 01/02/2020 Reason for consult: Follow-up assessment;Late-preterm 34-36.6wks;Primapara;1st time breastfeeding;Infant < 6lbs  P1 mother whose infant is now 53 weeks old.  This is a LPTI at 35+4 weeks with a CGA of 37+5 weeks, weighing < 6 lbs and in the NICU.  Arabic interpreters x 2 used for interpretation due to being disconnected with the first one.  [phone] and [id number]) used for interpretation.  RN in room and giving mother an update when I arrived.  Offered to assist with latching and mother agreeable.  Asked permission to feed STS and explained the benefits to feeding STS.  Mother seemed hesitant but agreeable.  Mother's breasts are soft and non tender and nipples are everted.  She informed me that this will be the third time she has attempted breast feeding.  Discussed expressing colostrum drops prior to latching.  Mother able to easily express drops and I assisted to latch in the cross cradle hold on the left breast.  Baby had a wide gape and flanged lips.  She began sucking immediately and after a minute, audible swallows were noted.  Asked mother to listen for swallows and she was pleased to hear this.  Observed baby feeding well for approximately 12 minutes.  Discussed breast feeding basics while observing.  When she became sleepy I demonstrated effective burping.  After baby burped she began showing more cues.  Assisted to latch to the right breast in the same position and baby fed for an additional 10 minutes.  Praised mother for her efforts.  Continue to educate mother on breast feeding basics.  Observe for proper hand/finger placement and remind mother to keep away from nipple/areola. She tends to compress near baby's nose to make an "air hole." Encouraged deep latches and effective burping.    Mother will call as needed for latch assistance when she is able to visit her  daughter.  She has brought back her [facility] loaner pump which is on the floor in her NICU room.  Explained that she needs to return this to [facility] and they will return her $30.  Mother now has her own personal WIC pump. Mother verbalized understanding.   Mother informed me that she is pumping approximately 4-5 four ounce bottle of milk with each pumping session.  She brought in 5 bottles today.  Bottles placed in the refrigerator.  RN updated.   Maternal Data Formula Feeding for Exclusion: Yes Reason for exclusion: Mother's choice to formula and breast feed on admission Has patient been taught Hand Expression?: Yes Does the patient have breastfeeding experience prior to this delivery?: No  Feeding Feeding Type: Breast Fed Nipple Type: Dr. Levert Feinstein Preemie  LATCH Score Latch: Grasps breast easily, tongue down, lips flanged, rhythmical sucking.  Audible Swallowing: Spontaneous and intermittent  Type of Nipple: Everted at rest and after stimulation  Comfort (Breast/Nipple): Soft / non-tender  Hold (Positioning): Assistance needed to correctly position infant at breast and maintain latch.  LATCH Score: 9  Interventions Interventions: Breast feeding basics reviewed;Assisted with latch;Skin to skin;Breast massage;Hand express;Breast compression;Adjust position;DEBP;Position options;Support pillows  Lactation Tools Discussed/Used Tools: Bottle;48F feeding tube / Syringe Breast pump type: Double-Electric Breast Pump;Manual WIC Program: Yes   Consult Status Consult Status: Follow-up Date: 01/03/20 Follow-up type: In-patient    Surie Suchocki R Cassian Torelli 01/02/2020, 2:45 PM

## 2020-01-08 ENCOUNTER — Encounter: Payer: Self-pay | Admitting: Obstetrics and Gynecology

## 2020-01-15 ENCOUNTER — Encounter: Payer: Self-pay | Admitting: Obstetrics and Gynecology

## 2020-01-15 ENCOUNTER — Other Ambulatory Visit: Payer: Self-pay

## 2020-01-15 ENCOUNTER — Encounter: Payer: Self-pay | Admitting: Obstetrics & Gynecology

## 2020-01-15 ENCOUNTER — Ambulatory Visit (INDEPENDENT_AMBULATORY_CARE_PROVIDER_SITE_OTHER): Payer: Medicaid Other | Admitting: Obstetrics and Gynecology

## 2020-01-15 VITALS — BP 109/76 | HR 69 | Wt 121.1 lb

## 2020-01-15 DIAGNOSIS — O0990 Supervision of high risk pregnancy, unspecified, unspecified trimester: Secondary | ICD-10-CM

## 2020-01-15 DIAGNOSIS — Z30430 Encounter for insertion of intrauterine contraceptive device: Secondary | ICD-10-CM

## 2020-01-15 DIAGNOSIS — Z1389 Encounter for screening for other disorder: Secondary | ICD-10-CM | POA: Diagnosis not present

## 2020-01-15 DIAGNOSIS — Z3202 Encounter for pregnancy test, result negative: Secondary | ICD-10-CM | POA: Diagnosis not present

## 2020-01-15 LAB — POCT PREGNANCY, URINE: Preg Test, Ur: NEGATIVE

## 2020-01-15 NOTE — Progress Notes (Signed)
Post Partum Visit Note  Kathy Wilkerson is a 34 y.o. G71P0110 female who presents for a postpartum visit. She is 4 weeks postpartum following a primary cesarean section.  I have fully reviewed the prenatal and intrapartum course. The delivery was at 35w 5w gestational weeks.  Anesthesia: spinal. Postpartum course has been uncomplicated. Baby is doing well. Baby is feeding by breast. Bleeding no bleeding. Bowel function is normalBladder function is normal. Patient is not sexually active. Contraception method is IUD. Postpartum depression screening: negative.   The pregnancy intention screening data noted above was reviewed. Potential methods of contraception were discussed. The patient elected to proceed with IUD or IUS.    Edinburgh Postnatal Depression Scale - 01/15/20 1355      Edinburgh Postnatal Depression Scale:  In the Past 7 Days   I have been able to laugh and see the funny side of things. 0    I have looked forward with enjoyment to things. 0    I have blamed myself unnecessarily when things went wrong. 0    I have been anxious or worried for no good reason. 0    I have felt scared or panicky for no good reason. 0    Things have been getting on top of me. 0    I have been so unhappy that I have had difficulty sleeping. 0    I have felt sad or miserable. 0    I have been so unhappy that I have been crying. 0    The thought of harming myself has occurred to me. 0    Edinburgh Postnatal Depression Scale Total 0          The following portions of the patient's history were reviewed and updated as appropriate: allergies, current medications, past family history, past medical history, past social history, past surgical history and problem list.  Review of Systems Pertinent items are noted in HPI.    Objective:  Weight 121 lb 1.6 oz (54.9 kg), last menstrual period 04/13/2019, currently breastfeeding.  General:  alert, cooperative and appears stated age  Lungs: clear to  auscultation bilaterally  Heart:  regular rate and rhythm, S1, S2 normal, no murmur, click, rub or gallop  Abdomen: soft, non-tender; bowel sounds normal; no masses,  no organomegaly   Vulva:  normal  Vagina: normal vagina  Cervix:  nulliparous appearance  Corpus: normal  Adnexa:  not evaluated  Rectal Exam: Not performed.        Assessment:    Normal postpartum exam. Pap smear not done at today's visit. Last pap 06/25/2019  Plan:   Essential components of care per ACOG recommendations:  1.  Mood and well being: Patient with negative depression screening today. Reviewed local resources for support.  - Patient does not use tobacco. If using tobacco we discussed reduction and for recently cessation risk of relapse - hx of drug use? No   If yes, discussed support systems  2. Infant care and feeding:  -Patient currently breastmilk feeding? Yes If breastmilk feeding discussed return to work and pumping. If needed, patient was provided letter for work to allow for every 2-3 hr pumping breaks, and to be granted a private location to express breastmilk and refrigerated area to store breastmilk. Reviewed importance of draining breast regularly to support lactation. -Social determinants of health (SDOH) reviewed in EPIC.The following needs were identified: None  3. Sexuality, contraception and birth spacing - Patient does not want a pregnancy in the next year.  Desired family size is 1 children.  - Reviewed forms of contraception in tiered fashion. Patient desired no method today.   - Discussed birth spacing of 18 months  4. Sleep and fatigue -Encouraged family/partner/community support of 4 hrs of uninterrupted sleep to help with mood and fatigue  5. Physical Recovery  - Discussed patients delivery and complications - Patient had a C-section, no concerns with incision.  - Patient has urinary incontinence? No - Patient is safe to resume physical and sexual activity  6.  Health  Maintenance - Last pap smear done 2021 and was normal with negative HPV.   7. Chronic Disease - PCP follow up  Venia Carbon, NP [facility]        GYNECOLOGY CLINIC PROCEDURE NOTE  Kathy Wilkerson is a 34 y.o. [phone] here for liletta IUD insertion. No GYN concerns.  Last pap smear was on 06/25/2019 and was normal.  IUD Insertion Procedure Note Patient identified, informed consent performed, consent signed.   Discussed risks of irregular bleeding, cramping, infection, malpositioning or misplacement of the IUD outside the uterus which may require further procedure such as laparoscopy. Time out was performed.  Urine pregnancy test negative.  Speculum placed in the vagina.  Cervix visualized.  Cleaned with Betadine x 2.  Grasped anteriorly with a single tooth tenaculum.  Uterus sounded to 7 cm.  Liletta IUD placed per manufacturer's recommendations.  Strings trimmed to 2 cm. Tenaculum was removed, good hemostasis noted.  Patient tolerated procedure well.   Patient was given post-procedure instructions.  She was advised to have backup contraception for one week.  Patient was also asked to check IUD strings periodically and follow up in 4 weeks for IUD check.   Venia Carbon I, NP 01/16/2020 4:10 PM

## 2020-01-16 MED ORDER — LEVONORGESTREL 19.5 MCG/DAY IU IUD
INTRAUTERINE_SYSTEM | Freq: Once | INTRAUTERINE | Status: AC
  Administered 2020-01-15: 1 via INTRAUTERINE

## 2020-02-17 ENCOUNTER — Encounter: Payer: Self-pay | Admitting: Student

## 2020-02-17 ENCOUNTER — Other Ambulatory Visit: Payer: Self-pay

## 2020-02-17 ENCOUNTER — Ambulatory Visit (INDEPENDENT_AMBULATORY_CARE_PROVIDER_SITE_OTHER): Payer: Medicaid Other | Admitting: Student

## 2020-02-17 VITALS — BP 121/89 | HR 71 | Wt 120.4 lb

## 2020-02-17 DIAGNOSIS — Z30431 Encounter for routine checking of intrauterine contraceptive device: Secondary | ICD-10-CM

## 2020-02-17 DIAGNOSIS — Z789 Other specified health status: Secondary | ICD-10-CM | POA: Diagnosis not present

## 2020-02-17 NOTE — Patient Instructions (Signed)
Health Maintenance, Female Adopting a healthy lifestyle and getting preventive care are important in promoting health and wellness. Ask your health care provider about:  The right schedule for you to have regular tests and exams.  Things you can do on your own to prevent diseases and keep yourself healthy. What should I know about diet, weight, and exercise? Eat a healthy diet   Eat a diet that includes plenty of vegetables, fruits, low-fat dairy products, and lean protein.  Do not eat a lot of foods that are high in solid fats, added sugars, or sodium. Maintain a healthy weight Body mass index (BMI) is used to identify weight problems. It estimates body fat based on height and weight. Your health care provider can help determine your BMI and help you achieve or maintain a healthy weight. Get regular exercise Get regular exercise. This is one of the most important things you can do for your health. Most adults should:  Exercise for at least 150 minutes each week. The exercise should increase your heart rate and make you sweat (moderate-intensity exercise).  Do strengthening exercises at least twice a week. This is in addition to the moderate-intensity exercise.  Spend less time sitting. Even light physical activity can be beneficial. Watch cholesterol and blood lipids Have your blood tested for lipids and cholesterol at 34 years of age, then have this test every 5 years. Have your cholesterol levels checked more often if:  Your lipid or cholesterol levels are high.  You are older than 34 years of age.  You are at high risk for heart disease. What should I know about cancer screening? Depending on your health history and family history, you may need to have cancer screening at various ages. This may include screening for:  Breast cancer.  Cervical cancer.  Colorectal cancer.  Skin cancer.  Lung cancer. What should I know about heart disease, diabetes, and high blood  pressure? Blood pressure and heart disease  High blood pressure causes heart disease and increases the risk of stroke. This is more likely to develop in people who have high blood pressure readings, are of African descent, or are overweight.  Have your blood pressure checked: ? Every 3-5 years if you are 18-39 years of age. ? Every year if you are 40 years old or older. Diabetes Have regular diabetes screenings. This checks your fasting blood sugar level. Have the screening done:  Once every three years after age 40 if you are at a normal weight and have a low risk for diabetes.  More often and at a younger age if you are overweight or have a high risk for diabetes. What should I know about preventing infection? Hepatitis B If you have a higher risk for hepatitis B, you should be screened for this virus. Talk with your health care provider to find out if you are at risk for hepatitis B infection. Hepatitis C Testing is recommended for:  Everyone born from 1945 through 1965.  Anyone with known risk factors for hepatitis C. Sexually transmitted infections (STIs)  Get screened for STIs, including gonorrhea and chlamydia, if: ? You are sexually active and are younger than 34 years of age. ? You are older than 34 years of age and your health care provider tells you that you are at risk for this type of infection. ? Your sexual activity has changed since you were last screened, and you are at increased risk for chlamydia or gonorrhea. Ask your health care provider if   you are at risk.  Ask your health care provider about whether you are at high risk for HIV. Your health care provider may recommend a prescription medicine to help prevent HIV infection. If you choose to take medicine to prevent HIV, you should first get tested for HIV. You should then be tested every 3 months for as long as you are taking the medicine. Pregnancy  If you are about to stop having your period (premenopausal) and  you may become pregnant, seek counseling before you get pregnant.  Take 400 to 800 micrograms (mcg) of folic acid every day if you become pregnant.  Ask for birth control (contraception) if you want to prevent pregnancy. Osteoporosis and menopause Osteoporosis is a disease in which the bones lose minerals and strength with aging. This can result in bone fractures. If you are 65 years old or older, or if you are at risk for osteoporosis and fractures, ask your health care provider if you should:  Be screened for bone loss.  Take a calcium or vitamin D supplement to lower your risk of fractures.  Be given hormone replacement therapy (HRT) to treat symptoms of menopause. Follow these instructions at home: Lifestyle  Do not use any products that contain nicotine or tobacco, such as cigarettes, e-cigarettes, and chewing tobacco. If you need help quitting, ask your health care provider.  Do not use street drugs.  Do not share needles.  Ask your health care provider for help if you need support or information about quitting drugs. Alcohol use  Do not drink alcohol if: ? Your health care provider tells you not to drink. ? You are pregnant, may be pregnant, or are planning to become pregnant.  If you drink alcohol: ? Limit how much you use to 0-1 drink a day. ? Limit intake if you are breastfeeding.  Be aware of how much alcohol is in your drink. In the [place] one drink equals one 12 oz bottle of beer (355 mL), one 5 oz glass of wine (148 mL), or one 1 oz glass of hard liquor (44 mL). General instructions  Schedule regular health, dental, and eye exams.  Stay current with your vaccines.  Tell your health care provider if: ? You often feel depressed. ? You have ever been abused or do not feel safe at home. Summary  Adopting a healthy lifestyle and getting preventive care are important in promoting health and wellness.  Follow your health care provider's instructions about healthy  diet, exercising, and getting tested or screened for diseases.  Follow your health care provider's instructions on monitoring your cholesterol and blood pressure. This information is not intended to replace advice given to you by your health care provider. Make sure you discuss any questions you have with your health care provider. Document Revised: 05/29/2018 Document Reviewed: 05/29/2018 Elsevier Patient Education  2020 Elsevier Inc.  

## 2020-02-17 NOTE — Progress Notes (Signed)
Language Resources [place].

## 2020-02-17 NOTE — Progress Notes (Signed)
     GYNECOLOGY OFFICE PROGRESS NOTE  History:  34 y.o. G3P0110 here today for today for IUD string check; liletta IUD was placed  01/16/2020. No complaints about the IUD, no concerning side effects.  The following portions of the patient's history were reviewed and updated as appropriate: allergies, current medications, past family history, past medical history, past social history, past surgical history and problem list. Last pap smear on 06/25/2019 was normal.  Review of Systems:  Pertinent items are noted in HPI.   Objective:  Physical Exam Blood pressure 121/89, pulse 71, weight 120 lb 6.4 oz (54.6 kg), last menstrual period 04/13/2019, currently breastfeeding. CONSTITUTIONAL: Well-developed, well-nourished female in no acute distress.  HENT:  Normocephalic, atraumatic. External right and left ear normal. Oropharynx is clear and moist EYES: Conjunctivae and EOM are normal. Pupils are equal, round, and reactive to light. No scleral icterus.  NECK: Normal range of motion, supple, no masses CARDIOVASCULAR: Normal heart rate noted RESPIRATORY: Effort and breath sounds normal, no problems with respiration noted ABDOMEN: Soft, no distention noted.   PELVIC: Normal appearing external genitalia; normal appearing vaginal mucosa and cervix.  IUD strings visualized, about 2 cm in length outside cervix.   Assessment & Plan:  1. IUD check up Normal IUD check. Patient to keep IUD in place for 6 years; can come in for removal if she desires pregnancy within the next 6 years. Routine preventative health maintenance measures emphasized  2. Language barrier -Arabic interpreter in person for this encounter  Judeth Horn, NP

## 2020-09-06 ENCOUNTER — Ambulatory Visit (INDEPENDENT_AMBULATORY_CARE_PROVIDER_SITE_OTHER): Payer: Medicaid Other | Admitting: *Deleted

## 2020-09-06 ENCOUNTER — Other Ambulatory Visit: Payer: Self-pay

## 2020-09-06 VITALS — BP 121/76 | HR 63 | Wt 118.4 lb

## 2020-09-06 DIAGNOSIS — N949 Unspecified condition associated with female genital organs and menstrual cycle: Secondary | ICD-10-CM | POA: Diagnosis not present

## 2020-09-06 LAB — POCT URINALYSIS DIP (DEVICE)
Bilirubin Urine: NEGATIVE
Glucose, UA: NEGATIVE mg/dL
Hgb urine dipstick: NEGATIVE
Ketones, ur: NEGATIVE mg/dL
Leukocytes,Ua: NEGATIVE
Nitrite: NEGATIVE
Protein, ur: 100 mg/dL — AB
Specific Gravity, Urine: >=1.03 (ref 1.005–1.030)
Urobilinogen, UA: 0.2 mg/dL (ref 0.0–1.0)
pH: 6 (ref 5.0–8.0)

## 2020-09-06 NOTE — Progress Notes (Addendum)
Video interpreter Lafonda Mosses [phone] used for encounter. Pt presents with c/o burning after intercourse and has concerns that this may be a symptom of bladder infection. I advised pt that this would not be a common symptom of bladder infection. Urinalysis was checked and found to be negative for infection. Pt was advised that she needs appointment with a provider to further evaluate her concerns. She voiced understanding and agreed to scheduled appt.   Chart reviewed for nurse visit. Agree with plan of care.   Currie Paris, NP 09/09/2020 9:38 PM

## 2020-09-07 ENCOUNTER — Encounter: Payer: Self-pay | Admitting: Nurse Practitioner

## 2020-09-07 ENCOUNTER — Ambulatory Visit (INDEPENDENT_AMBULATORY_CARE_PROVIDER_SITE_OTHER): Payer: Medicaid Other | Admitting: Nurse Practitioner

## 2020-09-07 ENCOUNTER — Other Ambulatory Visit (HOSPITAL_COMMUNITY)
Admission: RE | Admit: 2020-09-07 | Discharge: 2020-09-07 | Disposition: A | Discharge status: Home / Self Care | Payer: Medicaid Other | Source: Ambulatory Visit | Attending: Nurse Practitioner | Admitting: Nurse Practitioner

## 2020-09-07 VITALS — BP 121/77 | HR 70 | Ht 60.0 in | Wt 120.0 lb

## 2020-09-07 DIAGNOSIS — N949 Unspecified condition associated with female genital organs and menstrual cycle: Secondary | ICD-10-CM | POA: Diagnosis present

## 2020-09-07 NOTE — Progress Notes (Signed)
   GYNECOLOGY OFFICE VISIT NOTE   History:  35 y.o. G3P0110 here today for burning with intercourse since she has had the baby. She denies any abnormal vaginal discharge, bleeding, pelvic pain or other concerns.   Past Medical History:  Diagnosis Date  . Medical history non-contributory   . Preterm labor     Past Surgical History:  Procedure Laterality Date  . CERVICAL CERCLAGE N/A 07/10/2019   Procedure: CERCLAGE CERVICAL;  Surgeon: Tereso Newcomer, MD;  Location: [facility];  Service: Gynecology;  Laterality: N/A;  . CERVICAL CERCLAGE N/A 12/18/2019   Procedure: CERCLAGE CERVICAL removal;  Surgeon: Noonday Bing, MD;  Location: [facility];  Service: Obstetrics;  Laterality: N/A;  removal of cerclage  . CESAREAN SECTION N/A 12/18/2019   Procedure: CESAREAN SECTION;  Surgeon: [place] Bing, MD;  Location: [facility];  Service: Obstetrics;  Laterality: N/A;    The following portions of the patient's history were reviewed and updated as appropriate: allergies, current medications, past family history, past medical history, past social history, past surgical history and problem list.   Health Maintenance:  Normal pap 06-25-19.     Review of Systems:  Pertinent items noted in HPI and remainder of comprehensive ROS otherwise negative.  Objective:  Physical Exam BP 121/77   Pulse 70   Ht 5' (1.524 m)   Wt 120 lb (54.4 kg)   LMP  (LMP Unknown) Comment: has IUD , no periods  BMI 23.44 kg/m  CONSTITUTIONAL: Well-developed, well-nourished female in no acute distress.  HENT:  Normocephalic, atraumatic. External right and left ear normal.  EYES: Conjunctivae and EOM are normal. Pupils are equal, round.  No scleral icterus.  NECK: Normal range of motion, supple, no masses SKIN: Skin is warm and dry. No rash noted. Not diaphoretic. No erythema. No pallor. NEUROLOGIC: Alert and oriented to person, place, and time. Normal muscle tone coordination. No cranial nerve deficit  noted. PSYCHIATRIC: Normal mood and affect. Normal behavior. Normal judgment and thought content. CARDIOVASCULAR: Normal heart rate noted RESPIRATORY: Effort and breath sounds normal, no problems with respiration noted ABDOMEN: Soft, no distention noted.   PELVIC: No external lesions, no vaginal discharge, no erythema, IUD strings seen in cervical os.  No part of IUD body palpated in cervix on pelvic exam. MUSCULOSKELETAL: Normal range of motion. No edema noted.  Labs and Imaging No results found.  Assessment & Plan:  1. Vaginal burning Reports she has internal burning after sex and partner has burning after completing intercourse (not during) Reviewed less vaginal lubrication during sex when breastfeeding - given samples of KY jelly and advised to try using it during sex to decrease burning of both partners Also checking for infection but on pelvic exam, no abnormalities noted. Also discussed she is not having regular menses but only some cramping at the time menses would be expected but no bleeding - advised this is all normal with this IUD.  - Cervicovaginal ancillary only( [place])   Routine preventative health maintenance measures emphasized. Please refer to After Visit Summary for other counseling recommendations.   Return in about 1 year (around 09/07/2021) for annual exam.   Total face-to-face time with patient: 10 minutes.  Over 50% of encounter was spent on counseling and coordination of care.  Nolene Bernheim, RN, MSN, NP-BC Nurse Practitioner, [facility] for [employer], [facility] 09/07/2020 9:07 PM

## 2020-09-07 NOTE — Patient Instructions (Addendum)
TRY [place] Jelly for intercourse

## 2020-09-07 NOTE — Progress Notes (Signed)
Here because having burning  after intercourse since she had her baby 8 months ago. Denies vaginal discharge. Larine Fielding,RN

## 2020-09-08 LAB — CERVICOVAGINAL ANCILLARY ONLY
Bacterial Vaginitis (gardnerella): NEGATIVE
Candida Glabrata: NEGATIVE
Candida Vaginitis: NEGATIVE
Chlamydia: NEGATIVE
Comment: NEGATIVE
Comment: NEGATIVE
Comment: NEGATIVE
Comment: NEGATIVE
Comment: NEGATIVE
Comment: NORMAL
Neisseria Gonorrhea: NEGATIVE
Trichomonas: NEGATIVE

## 2022-03-23 IMAGING — US US MFM OB FOLLOW-UP
1 series · 13 of 28 positions shown · non-contrast
Comparison: none

[Series 1: us mfm ob follow-up · 13 of 74 slices shown]
[im 3/74]
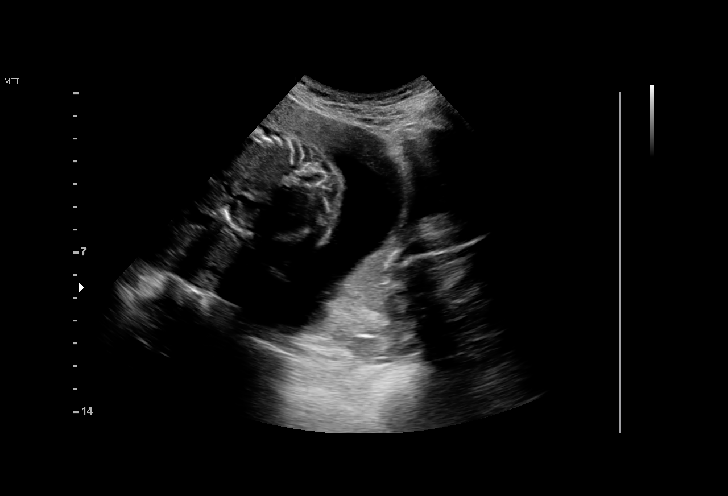
[im 9/74]
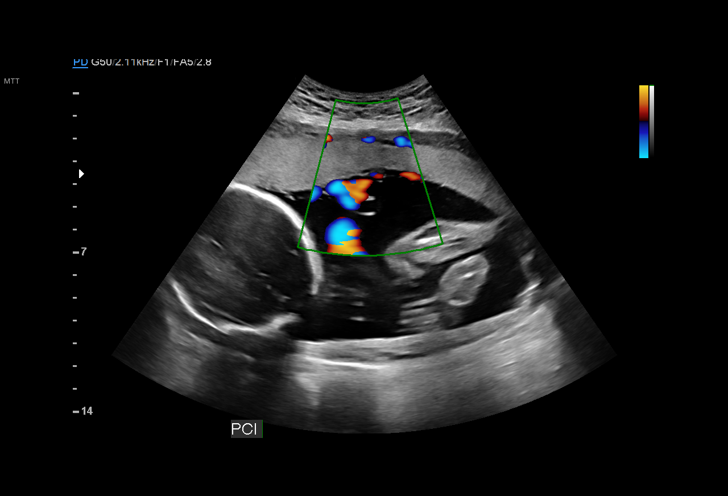
[im 14/74]
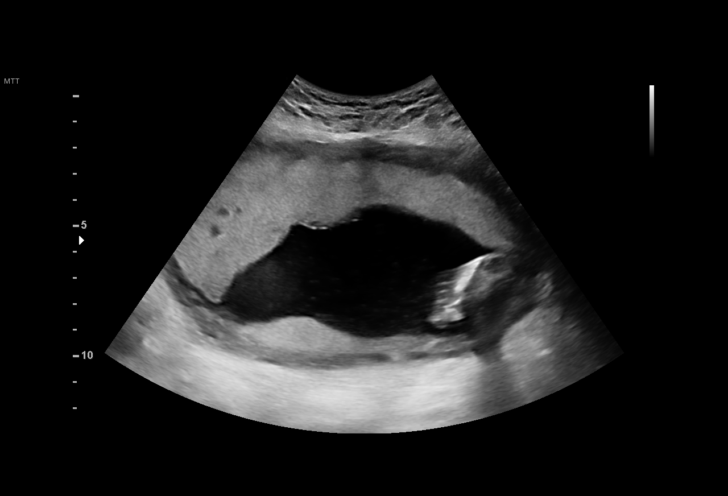
[im 19/74]
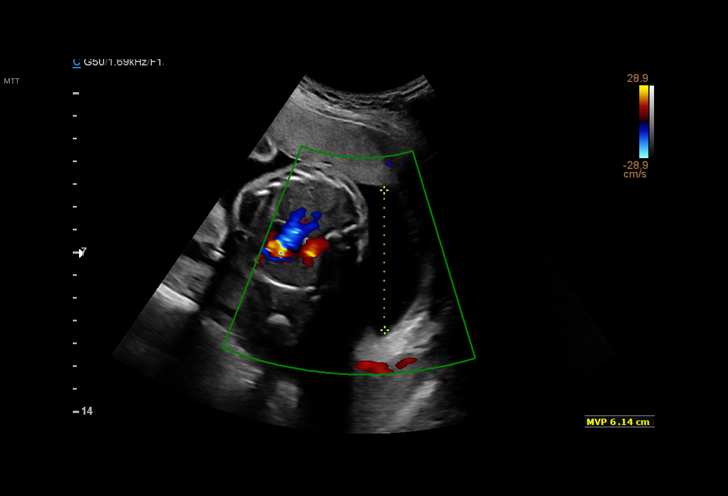
[im 25/74]
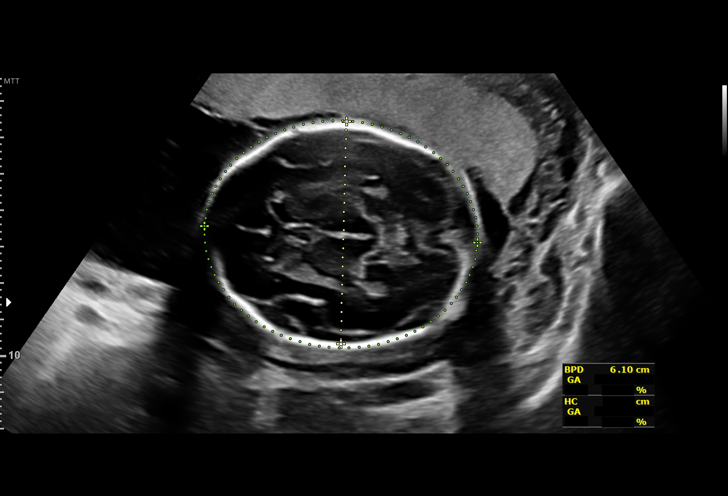
[im 30/74]
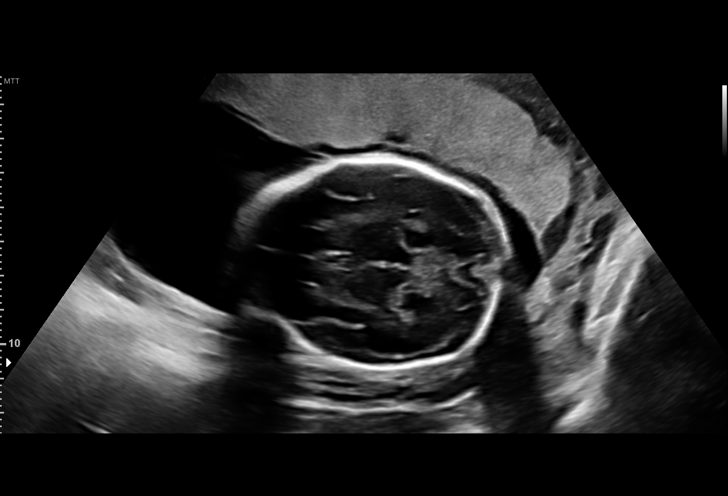
[im 38/74]
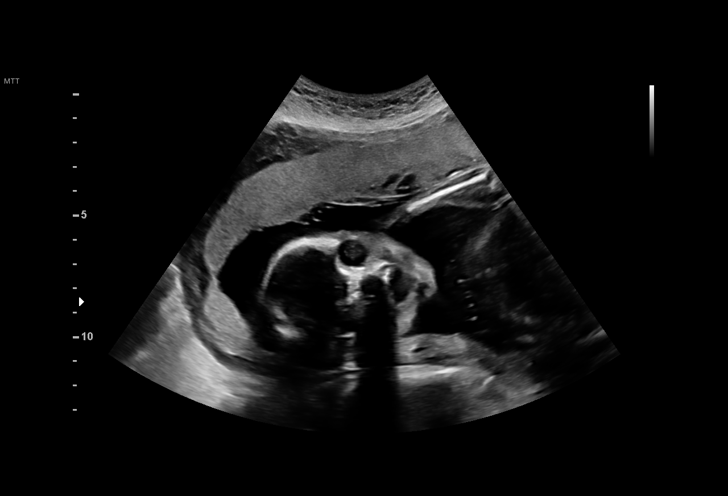
[im 44/74]
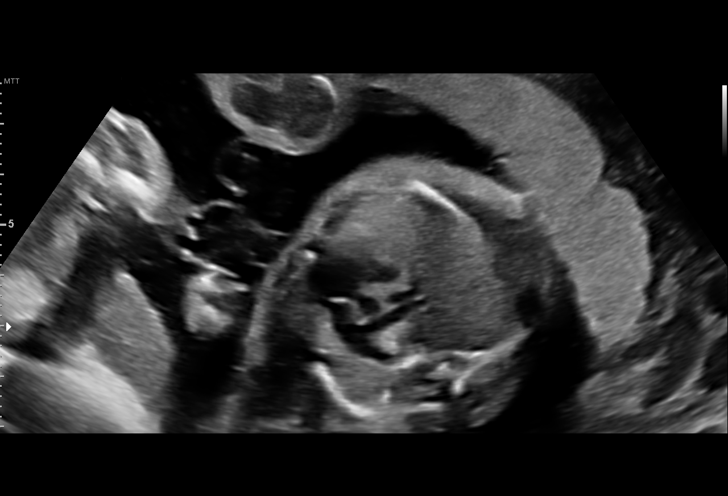
[im 49/74]
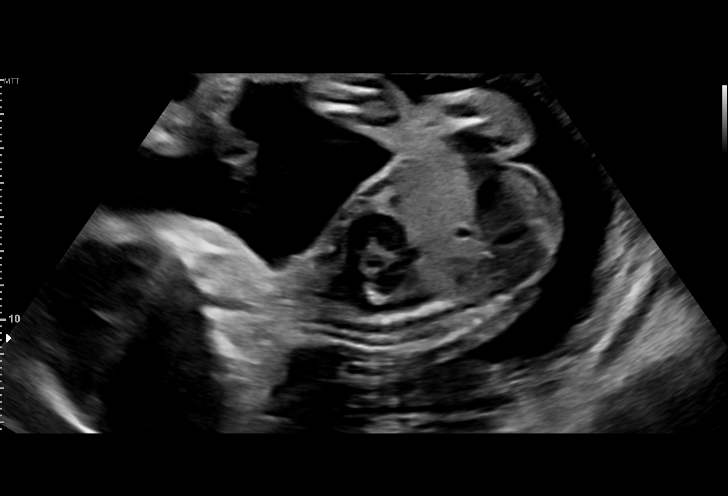
[im 55/74]
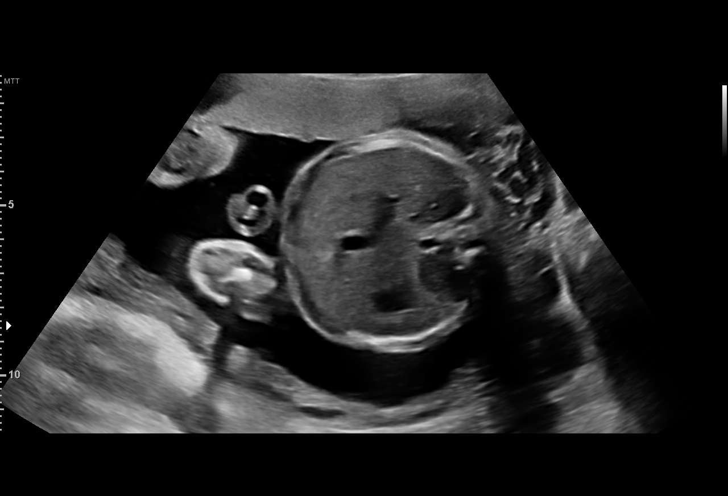
[im 60/74]
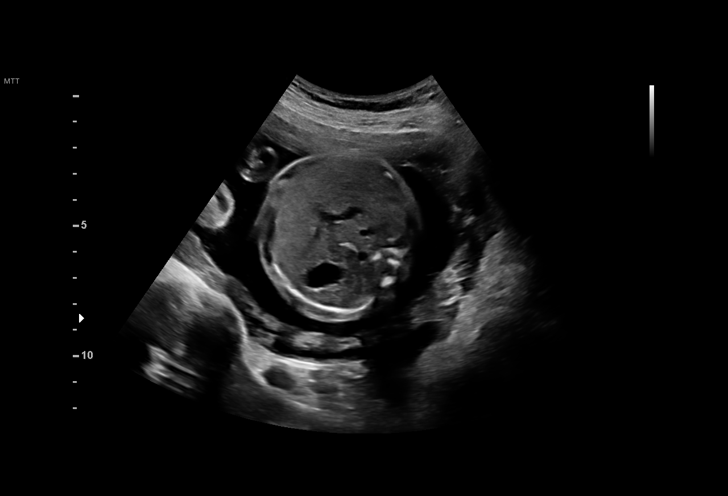
[im 65/74]
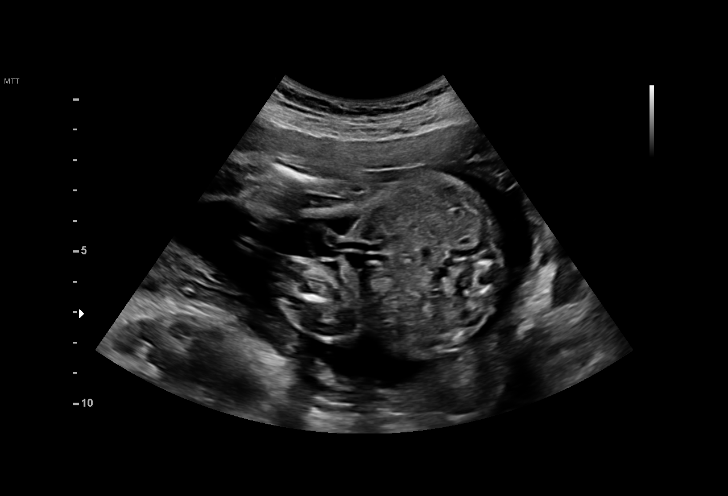
[im 71/74]
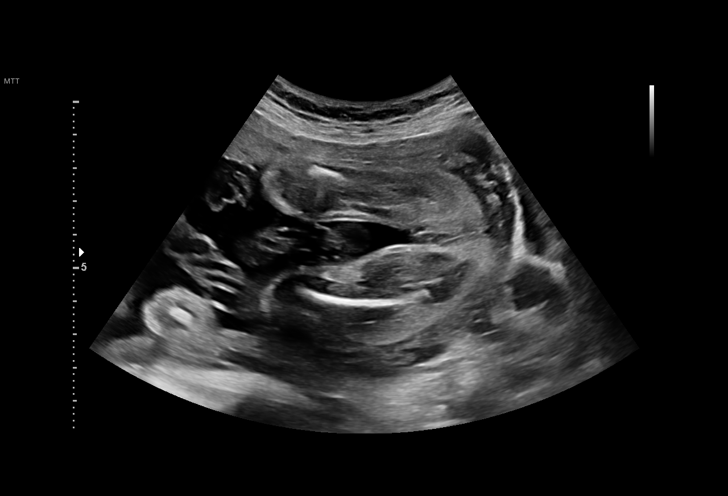

[13 of 28 positions shown; findings below may reference images not displayed]

[address]

 ----------------------------------------------------------------------

 ----------------------------------------------------------------------
Indications

  23 weeks gestation of pregnancy
  Poor obstetric history: Previous neonatal
  death
  Cervical cerclage suture present, second
  trimester
  Poor obstetric history: Previous midtrimester
  loss
 ----------------------------------------------------------------------
Fetal Evaluation

 Num Of Fetuses:         1
 Fetal Heart Rate(bpm):  146
 Cardiac Activity:       Observed
 Presentation:           Transverse, head to maternal right
 Placenta:               Anterior
 P. Cord Insertion:      Previously Visualized

 Amniotic Fluid
 AFI FV:      Within normal limits

                             Largest Pocket(cm)

Biometry

 BPD:      60.8  mm     G. Age:  24w 5d         90  %    CI:        77.26   %    70 - 86
                                                         FL/HC:      18.9   %    19.2 -
 HC:       219   mm     G. Age:  24w 0d         61  %    HC/AC:      1.07        1.05 -
 AC:      205.6  mm     G. Age:  25w 1d         91  %    FL/BPD:     68.1   %    71 - 87
 FL:       41.4  mm     G. Age:  23w 3d         43  %    FL/AC:      20.1   %    20 - 24
 CER:      25.8  mm     G. Age:  23w 6d         57  %

 LV:        6.8  mm
 CM:        9.3  mm
 Est. FW:     691  gm      1 lb 8 oz     90  %
OB History

 Gravidity:    3         Term:   0        Prem:   1        SAB:   1
 TOP:          0       Ectopic:  0        Living: 0
Gestational Age

 LMP:           23w 2d        Date:  04/13/19                 EDD:   01/18/20
 U/S Today:     24w 2d                                        EDD:   01/11/20
 Best:          23w 2d     Det. By:  LMP  (04/13/19)          EDD:   01/18/20
Anatomy

 Cranium:               Appears normal         Aortic Arch:            Previously seen
 Cavum:                 Appears normal         Ductal Arch:            Appears normal
 Ventricles:            Appears normal         Diaphragm:              Appears normal
 Choroid Plexus:        Appears normal         Stomach:                Appears normal, left
                                                                       sided
 Cerebellum:            Appears normal         Abdomen:                Previously seen
 Posterior Fossa:       Appears normal         Abdominal Wall:         Previously seen
 Nuchal Fold:           Previously seen        Cord Vessels:           Appears normal (3
                                                                       vessel cord)
 Face:                  Orbits and profile     Kidneys:                Appear normal
                        previously seen
 Lips:                  Appears normal         Bladder:                Appears normal
 Thoracic:              Appears normal         Spine:                  Previously seen
 Heart:                 Appears normal         Upper Extremities:      Previously seen
                        (4CH, axis, and
                        situs)
 RVOT:                  Appears normal         Lower Extremities:      Previously seen
 LVOT:                  Appears normal

 Other:  Heels previously seen. Hands visualized prev. 3VV and 3VTV
         visualized prev.
Cervix Uterus Adnexa

 Cervix
 Length:           3.56  cm.
 Normal appearance by transabdominal scan.

 Uterus
 No abnormality visualized.

 Left Ovary
 Within normal limits. No adnexal mass visualized.

 Right Ovary
 Within normal limits. No adnexal mass visualized.

 Cul De Sac
 No free fluid seen.

 Adnexa
 No abnormality visualized.
Impression

 History of preterm delivery and a midtrimester pregnancy
 loss.  Patient takes 17 alpha hydroxyprogesterone
 prophylaxis.  She had prophylactic cerclage in this pregnancy.

 Amniotic fluid is normal and good fetal activity is seen. Fetal
 biometry is consistent with her previously-established dates.
 Fetal anatomical survey was completed and appears normal.
 On transabdominal scan, the cervix looks long and closed.
 Cerclage is in place.

 I reassured the patient of the findings with the help of
 language interpreter present in the room.  Patient does not
 have any symptoms of uterine contractions.
Recommendations

 Follow-up scans as clinically indicated.
                 Anzois, Hugo

## 2022-04-23 IMAGING — US US MFM OB LIMITED
1 series · 15 of 18 positions shown · non-contrast
Comparison: none

[Series 1: us mfm ob limited · 15 of 18 slices shown]
[im 1/18]
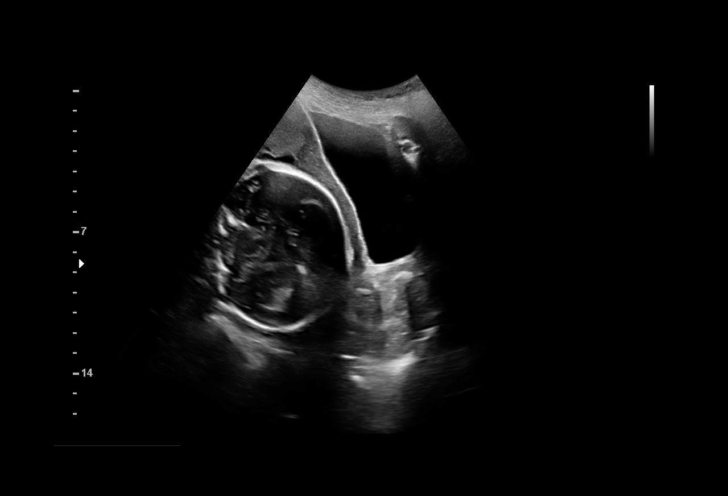
[im 2/18]
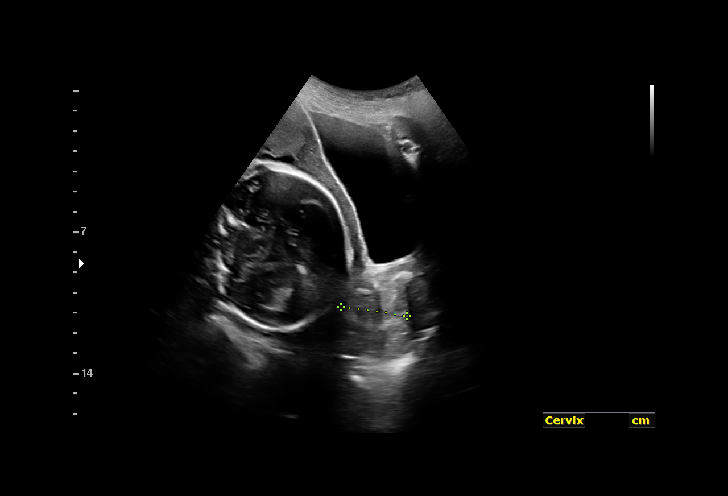
[im 4/18]
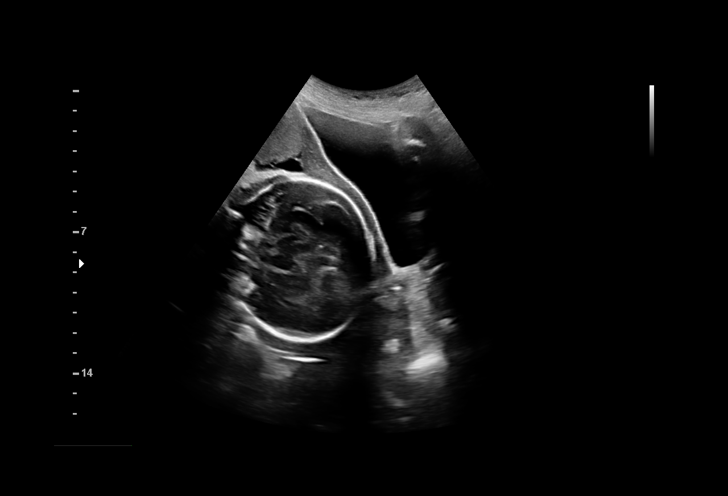
[im 5/18]
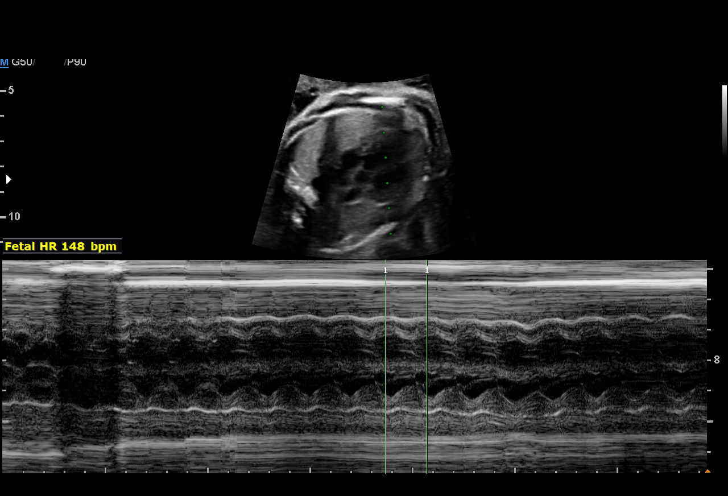
[im 6/18]
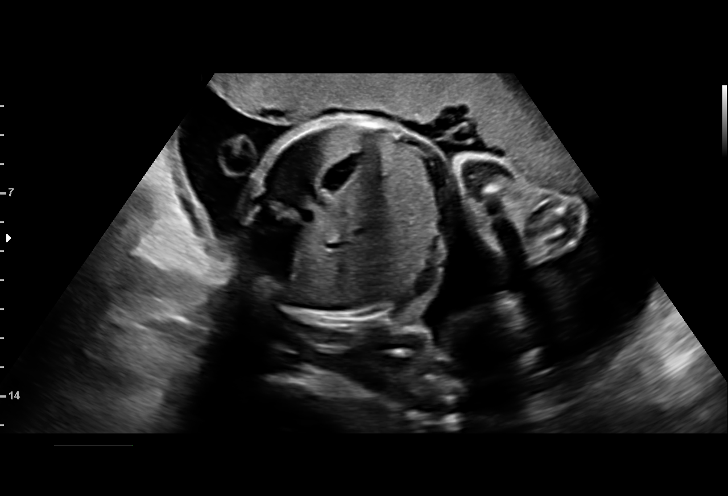
[im 7/18]
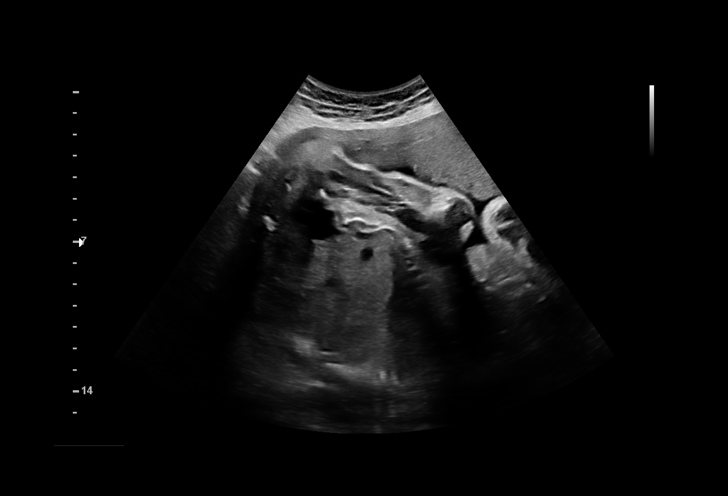
[im 8/18]
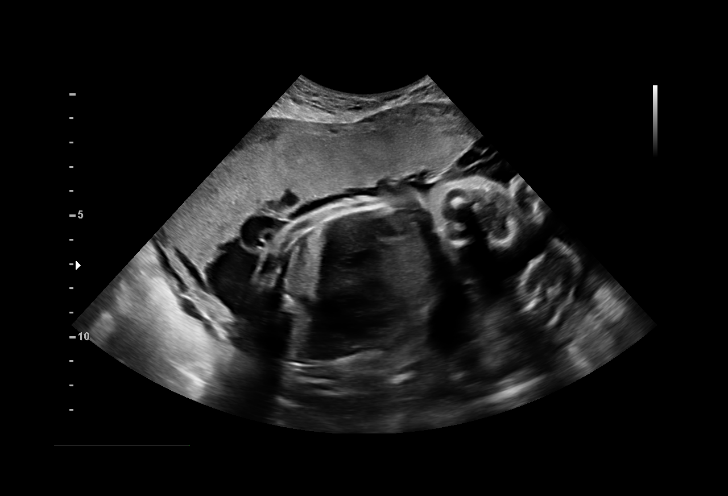
[im 10/18]
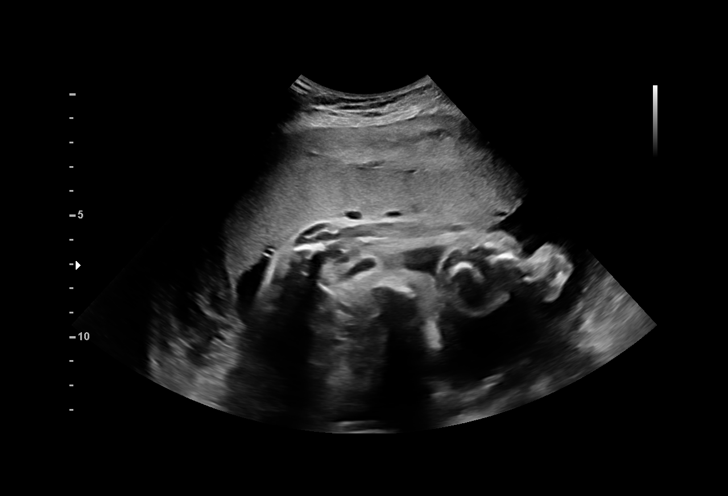
[im 11/18]
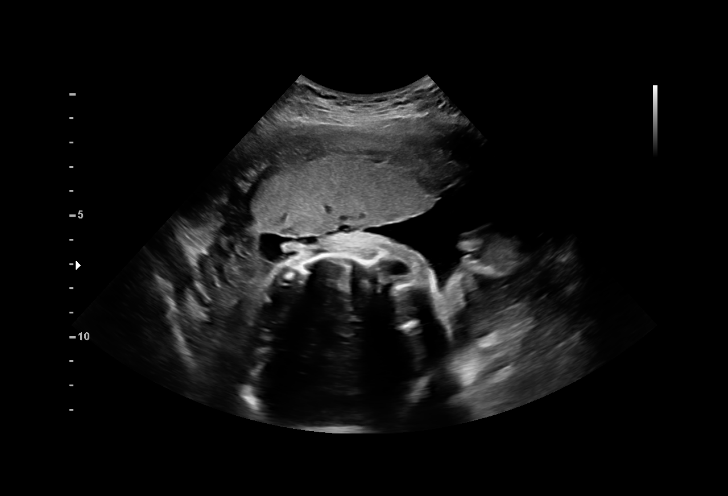
[im 12/18]
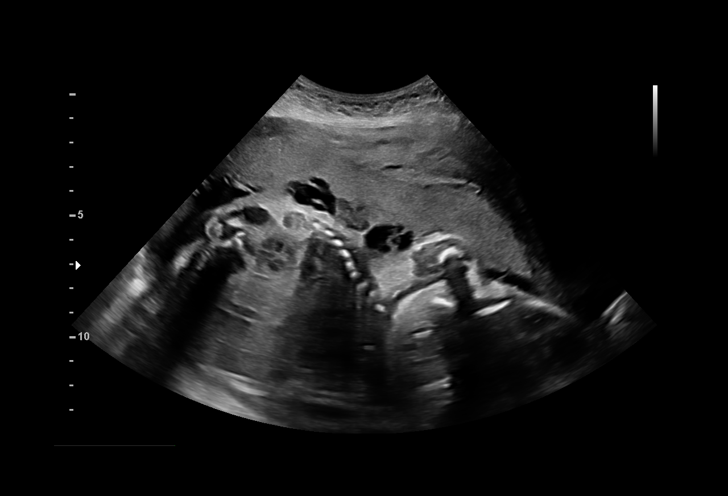
[im 13/18]
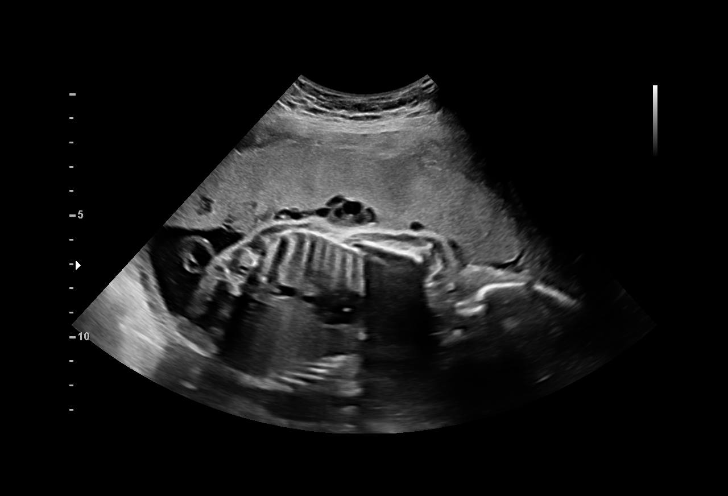
[im 14/18]
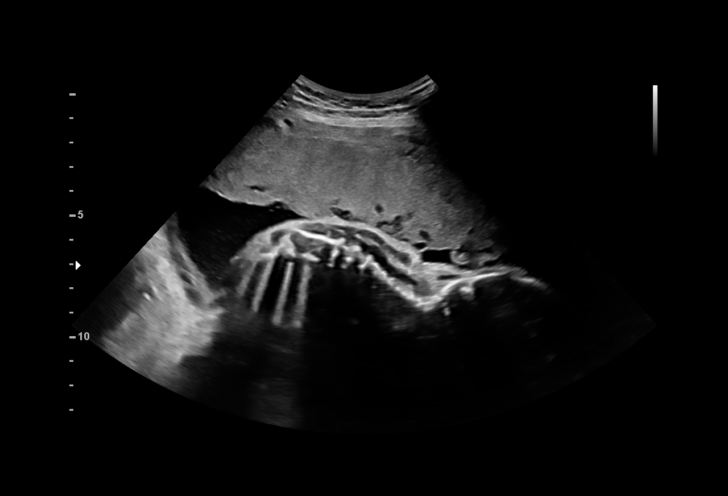
[im 16/18]
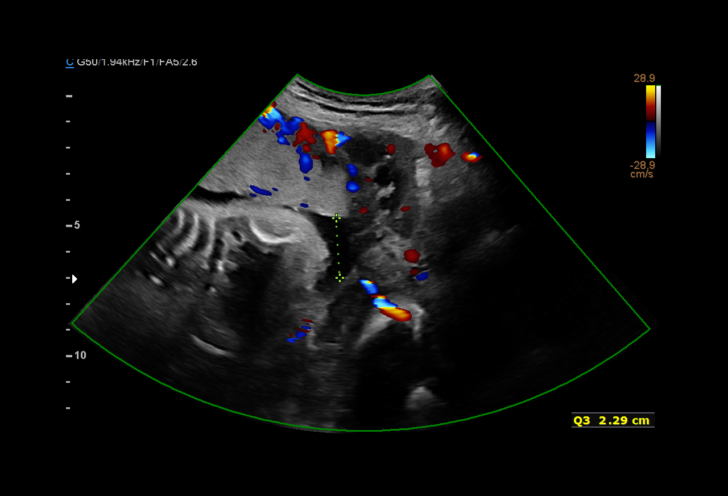
[im 17/18]
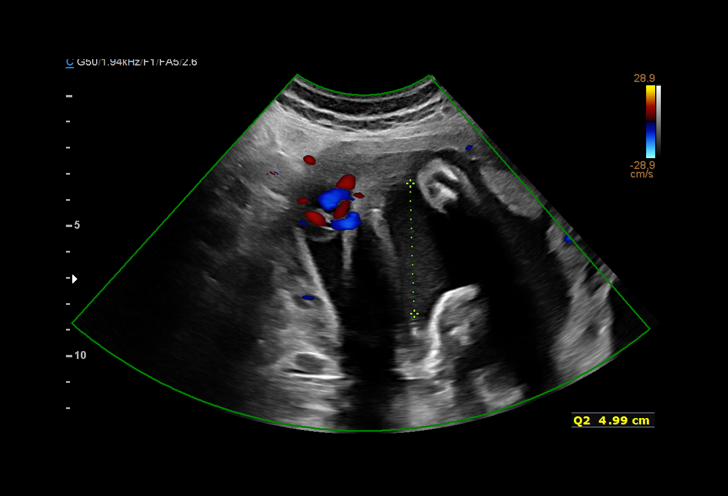
[im 18/18]
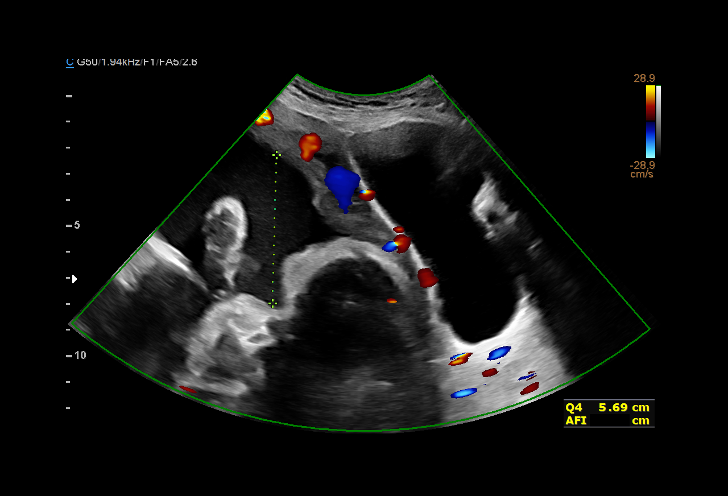

[15 of 18 positions shown; findings below may reference images not displayed]

Indications

 Vaginal bleeding in pregnancy, second
 trimester
 27 weeks gestation of pregnancy
 Poor obstetric history: Previous neonatal death
 Cervical cerclage suture present, second
 trimester
 Poor obstetric history: Previous midtrimester
 loss
Fetal Evaluation

 Num Of Fetuses:          1
 Fetal Heart Rate(bpm):   148
 Cardiac Activity:        Observed
 Presentation:            Cephalic
 Placenta:                No abruption or previa seen, anterior

 Amniotic Fluid
 AFI FV:      Within normal limits

 AFI Sum(cm)     %Tile       Largest Pocket(cm)
 16.8            62

 RUQ(cm)       RLQ(cm)        LUQ(cm)        LLQ(cm)
 3.8           5.7            5
OB History

 Gravidity:     3         Term:  0          Prem:  1        SAB:   1
 TOP:           0       Ectopic: 0         Living: 0
Gestational Age

 LMP:            27w 5d       Date:  04/13/19                   EDD:  01/18/20
 Best:           27w 5d    Det. By:  LMP  (04/13/19)            EDD:  01/18/20
Cervix Uterus Adnexa

 Cervix
 Length:            3.29  cm.
 Normal appearance by transabdominal scan. Cerclage visualized.
Impression

 Limited exam for vaginal bleeding
 Known cerclage placement, which is seen today.
 Good fetal movement and amniotic fluid, no evidence of placenta
 previa or abruption.
Recommendations

 Follow up clincally indicated.

## 2023-10-24 ENCOUNTER — Other Ambulatory Visit: Payer: Self-pay

## 2023-10-24 ENCOUNTER — Other Ambulatory Visit (HOSPITAL_COMMUNITY)
Admission: RE | Admit: 2023-10-24 | Discharge: 2023-10-24 | Disposition: A | Discharge status: Home / Self Care | Source: Ambulatory Visit | Attending: Obstetrics and Gynecology | Admitting: Obstetrics and Gynecology

## 2023-10-24 ENCOUNTER — Ambulatory Visit (INDEPENDENT_AMBULATORY_CARE_PROVIDER_SITE_OTHER): Payer: Self-pay | Admitting: Obstetrics and Gynecology

## 2023-10-24 VITALS — BP 135/81 | HR 73 | Ht 64.0 in | Wt 120.2 lb

## 2023-10-24 DIAGNOSIS — Z3169 Encounter for other general counseling and advice on procreation: Secondary | ICD-10-CM

## 2023-10-24 DIAGNOSIS — Z124 Encounter for screening for malignant neoplasm of cervix: Secondary | ICD-10-CM | POA: Diagnosis present

## 2023-10-24 DIAGNOSIS — N939 Abnormal uterine and vaginal bleeding, unspecified: Secondary | ICD-10-CM | POA: Insufficient documentation

## 2023-10-24 DIAGNOSIS — Z1151 Encounter for screening for human papillomavirus (HPV): Secondary | ICD-10-CM | POA: Insufficient documentation

## 2023-10-24 NOTE — Progress Notes (Signed)
 NEW GYNECOLOGY PATIENT Patient name: Kathy Wilkerson MRN [id number]  Date of birth: 1986/06/17 Chief Complaint:   Gynecologic Exam (After removing IUD, patient has been bleeding for a full month, some pain is associated with the bleeding, intermittent )     History:  Kathy Wilkerson is a 38 y.o. G3P0110 being seen today for AUB after IUD removal. Went to different facility to have IUD removed approximately 7 months ago.     Prior to IUD removal, amenorrheic. After IUD removal, first few months there was light bleeding, 5-6 days which is her baseline menses. Last month she had month long bleeding and this month is light. This month was just 5 days. IUD removed to get pregnant. No pos UPT since IUD removed. No new ediatinos or health cahgnes. No weight changes, no breast or nipple change, no new headaches, normal headaches. Light bleeding throughout the month. Occasional bleeding. No hot flashes or vaginal dryness. No problems w/ IUD removal   Gynecologic History No LMP recorded. Contraception: none Last Pap:     Component Value Date/Time   DIAGPAP  06/25/2019 1054    - Negative for intraepithelial lesion or malignancy (NILM)   ADEQPAP  06/25/2019 1054    Satisfactory for evaluation; transformation zone component PRESENT.    High Risk HPV: Positive  Adequacy:  Satisfactory for evaluation, transformation zone component PRESENT  Diagnosis:  Atypical squamous cells of undetermined significance (ASC-US )  Last Mammogram: n/a Last Colonoscopy: n/a  Obstetric History OB History  Gravida Para Term Preterm AB Living  3 1  1 1  0  SAB IAB Ectopic Multiple Live Births  1    1    # Outcome Date GA Lbr Len/2nd Weight Sex Type Anes PTL Lv  3 Gravida           2 SAB 10/06/18 [redacted]w[redacted]d   F  None  FD     Birth Comments: Placenta delivered around 1030, large retroplacental clots, EBL 800 ml     Complications: Other Excessive Bleeding  1 Preterm 11/05/17 [redacted]w[redacted]d  4 lb 6.6 oz (2 kg) F Vag-Spont  N ND      Birth Comments: bleeding    Past Medical History:  Diagnosis Date   Medical history non-contributory    Preterm labor     Past Surgical History:  Procedure Laterality Date   CERVICAL CERCLAGE N/A 07/10/2019   Procedure: CERCLAGE CERVICAL;  Surgeon: Julianne Octave, MD;  Location: [facility] LD [facility]S;  Service: Gynecology;  Laterality: N/A;   CERVICAL CERCLAGE N/A 12/18/2019   Procedure: CERCLAGE CERVICAL removal;  Surgeon: Raynell Caller, MD;  Location: [facility];  Service: Obstetrics;  Laterality: N/A;  removal of cerclage   CESAREAN SECTION N/A 12/18/2019   Procedure: CESAREAN SECTION;  Surgeon: Raynell Caller, MD;  Location: [facility];  Service: Obstetrics;  Laterality: N/A;    No current outpatient medications on file prior to visit.   No current facility-administered medications on file prior to visit.    No Known Allergies  Social History:  reports that she has never smoked. She has never used smokeless tobacco. She reports that she does not drink alcohol and does not use drugs.  Family History  Problem Relation Age of Onset   Healthy Mother    Diabetes Father    Heart disease Father    Hypertension Father     The following portions of the patient's history were reviewed and updated as appropriate: allergies, current medications, past family history, past medical  history, past social history, past surgical history and problem list.  Review of Systems Pertinent items noted in HPI and remainder of comprehensive ROS otherwise negative.  Physical Exam:  BP 135/81 (BP Location: Right Arm, Patient Position: Sitting, Cuff Size: Normal)   Pulse 73   Ht 5\' 4"  (1.626 m)   Wt 120 lb 3.2 oz (54.5 kg)   SpO2 100%   BMI 20.63 kg/m  Physical Exam Vitals and nursing note reviewed. Exam conducted with a chaperone present.  Constitutional:      Appearance: Normal appearance.  Cardiovascular:     Rate and Rhythm: Normal rate.  Pulmonary:     Effort: Pulmonary effort is normal.      Breath sounds: Normal breath sounds.  Genitourinary:    General: Normal vulva.     Vagina: Normal.     Cervix: Normal. No cervical motion tenderness, discharge, lesion, erythema or cervical bleeding.  Neurological:     General: No focal deficit present.     Mental Status: She is alert and oriented to person, place, and time.  Psychiatric:        Mood and Affect: Mood normal.        Behavior: Behavior normal.        Thought Content: Thought content normal.        Judgment: Judgment normal.        Assessment and Plan:  1. Abnormal uterine bleeding (AUB) (Primary) Reports history of 1 month of prolonged abnormal bleeding, no clear preceding event.  AUB labs collected today.  If labs all within normal will plan for pelvic ultrasound. - Hemoglobin A1c - Follicle stimulating hormone - TSH Rfx on Abnormal to Free T4 - Cytology - PAP  2. Screening for cervical cancer Pap smear collected today - Cytology - PAP  3. Encounter for preconception consultation Patient attempting to conceive for the last 7 months, without much success.  If AUB labs normal, will follow-up pelvic ultrasound.  Consideration of possible ovulation induction to help with conception.  US  if labs normal   Routine preventative health maintenance measures emphasized. Please refer to After Visit Summary for other counseling recommendations.   Follow-up: No follow-ups on file.      Kiki Pelton, MD Obstetrician & Gynecologist, Faculty Practice [facility] for [employer], [facility]

## 2023-10-24 NOTE — Addendum Note (Signed)
 Addended by: Jahan Friedlander O on: 10/24/2023 01:23 PM   Modules accepted: Level of Service

## 2023-10-25 LAB — TSH RFX ON ABNORMAL TO FREE T4: TSH: 2.62 u[IU]/mL (ref 0.450–4.500)

## 2023-10-25 LAB — HEMOGLOBIN A1C
Est. average glucose Bld gHb Est-mCnc: 100 mg/dL
Hgb A1c MFr Bld: 5.1 % (ref 4.8–5.6)

## 2023-10-25 LAB — FOLLICLE STIMULATING HORMONE: FSH: 5.2 m[IU]/mL

## 2023-10-30 ENCOUNTER — Ambulatory Visit: Payer: Self-pay | Admitting: Obstetrics and Gynecology

## 2023-10-30 LAB — CYTOLOGY - PAP
Adequacy: ABSENT
Comment: NEGATIVE
Diagnosis: NEGATIVE
High risk HPV: NEGATIVE

## 2023-10-31 ENCOUNTER — Other Ambulatory Visit: Payer: Self-pay | Admitting: Obstetrics and Gynecology

## 2023-10-31 DIAGNOSIS — N939 Abnormal uterine and vaginal bleeding, unspecified: Secondary | ICD-10-CM

## 2023-11-07 ENCOUNTER — Telehealth: Payer: Self-pay | Admitting: *Deleted

## 2023-11-07 NOTE — Telephone Encounter (Signed)
 Received message from Dr. Elester Grim: Ajewole, Christana, MD to [facility] Clinical Pool (Selected Message)    10/31/23  1:25 PM Result Note Notify that her labs were normal including pap smear. Recommend repeat pap smear in 3-5 years. Recommend pelvic ultrasound for further evaluation   I called patient with [employer] # [phone] to her mobile and  heard a message no voicemail set up. Alejandra Hurst

## 2023-11-08 ENCOUNTER — Telehealth: Payer: Self-pay

## 2023-11-08 NOTE — Telephone Encounter (Signed)
 Pt's Husband called to get test results for wife on Nurse line on 11/07/23 @ 2:28p.

## 2023-11-08 NOTE — Telephone Encounter (Signed)
 Called Pt using Arabic [employer] [place] id# [phone], to go over test results from 10/24/23 visit, explained every thing came back normal. Pt verbalized understanding.

## 2023-11-22 NOTE — Telephone Encounter (Signed)
 I called patient at mobile number with [employer] [phone] and heard a message voicemail is not set up. I called home number also and hears message voicemail not set up. Will send letter. Also scheduled US  for Kathy Wilkerson on 12/06/23 arrive 12:45. Alejandra Hurst

## 2023-12-06 ENCOUNTER — Ambulatory Visit (HOSPITAL_COMMUNITY)
Admission: RE | Admit: 2023-12-06 | Discharge: 2023-12-06 | Disposition: A | Discharge status: Home / Self Care | Payer: Self-pay | Source: Ambulatory Visit | Attending: Obstetrics and Gynecology | Admitting: Obstetrics and Gynecology

## 2023-12-06 DIAGNOSIS — N939 Abnormal uterine and vaginal bleeding, unspecified: Secondary | ICD-10-CM | POA: Insufficient documentation

## 2023-12-17 ENCOUNTER — Ambulatory Visit: Payer: Self-pay | Admitting: Obstetrics and Gynecology

## 2023-12-19 NOTE — Telephone Encounter (Signed)
 Called patient with assistance of [employer], # [phone].   Called home number and spoke with patient. She was informed of US  results. Informed her to follow up in the office with Dr. Jeralyn. Patient would like for front office to call and schedule follow up.

## 2023-12-19 NOTE — Telephone Encounter (Signed)
-----   Message from [place] sent at 12/17/2023  4:18 PM EDT ----- Notify that ultrasound shows uterus is normal sized without any fibroids and the lining appears thin. Can follow up to discuss possible medications to help with ovulation  ----- Message ----- From: Interface, Rad Results In Sent: 12/17/2023   3:26 AM EDT To: Carter Quarry, MD

## 2024-01-28 ENCOUNTER — Other Ambulatory Visit: Payer: Self-pay

## 2024-01-28 ENCOUNTER — Ambulatory Visit (INDEPENDENT_AMBULATORY_CARE_PROVIDER_SITE_OTHER): Payer: Self-pay | Admitting: Obstetrics and Gynecology

## 2024-01-28 VITALS — BP 116/82 | HR 73 | Wt 128.0 lb

## 2024-01-28 DIAGNOSIS — Z3169 Encounter for other general counseling and advice on procreation: Secondary | ICD-10-CM

## 2024-01-28 DIAGNOSIS — Z1331 Encounter for screening for depression: Secondary | ICD-10-CM

## 2024-01-28 DIAGNOSIS — N939 Abnormal uterine and vaginal bleeding, unspecified: Secondary | ICD-10-CM

## 2024-01-28 MED ORDER — LETROZOLE 2.5 MG PO TABS
2.5000 mg | ORAL_TABLET | Freq: Every day | ORAL | 2 refills | Status: AC
Start: 2024-01-28 — End: ?

## 2024-01-28 NOTE — Patient Instructions (Addendum)
 Ovulation predictor Kit to see if you are ovulation  Prenatal vitamins   If you get a negative on the ovulation test, take letrozole  on days 3 -7 of your cycle. You anticipate ovulation between day 14 and 19. Use an ovulation predictor kit to see if you have ovulated. Have sex every other day around day 10 for a week.

## 2024-01-28 NOTE — Progress Notes (Signed)
 GYNECOLOGY VISIT  Patient name: Kathy Wilkerson MRN [id number]  Date of birth: 05-01-1986 Chief Complaint:   Follow-up   History:  Kathy Wilkerson is a 38 y.o. G3P0110 being seen today for follow up of AUB and TTC. Had 1 month of bleeding after IUD removal. Menses are 6-7 days are lighter than before. Did not need medication to get pregnant before. Not using any ovulation predictor kit at home and has not used before.   Past Medical History:  Diagnosis Date   Medical history non-contributory    Preterm labor     Past Surgical History:  Procedure Laterality Date   CERVICAL CERCLAGE N/A 07/10/2019   Procedure: CERCLAGE CERVICAL;  Surgeon: Herchel Gloris LABOR, MD;  Location: [facility];  Service: Gynecology;  Laterality: N/A;   CERVICAL CERCLAGE N/A 12/18/2019   Procedure: CERCLAGE CERVICAL removal;  Surgeon: Izell Harari, MD;  Location: [facility];  Service: Obstetrics;  Laterality: N/A;  removal of cerclage   CESAREAN SECTION N/A 12/18/2019   Procedure: CESAREAN SECTION;  Surgeon: Izell Harari, MD;  Location: [facility];  Service: Obstetrics;  Laterality: N/A;    The following portions of the patient's history were reviewed and updated as appropriate: allergies, current medications, past family history, past medical history, past social history, past surgical history and problem list.   Health Maintenance:   Last pap     Component Value Date/Time   DIAGPAP  10/24/2023 1140    - Negative for intraepithelial lesion or malignancy (NILM)   DIAGPAP  06/25/2019 1054    - Negative for intraepithelial lesion or malignancy (NILM)   HPVHIGH Negative 10/24/2023 1140   ADEQPAP  10/24/2023 1140    Satisfactory for evaluation; transformation zone component ABSENT.   ADEQPAP  06/25/2019 1054    Satisfactory for evaluation; transformation zone component PRESENT.    High Risk HPV: Positive  Adequacy:  Satisfactory for evaluation, transformation zone component PRESENT  Diagnosis:  Atypical  squamous cells of undetermined significance (ASC-US )  Last mammogram: n/a   Review of Systems:  Pertinent items are noted in HPI. Comprehensive review of systems was otherwise negative.   Objective:  Physical Exam BP 116/82   Pulse 73   Wt 128 lb (58.1 kg)   LMP 01/18/2024   BMI 21.97 kg/m    Physical Exam Vitals and nursing note reviewed.  Constitutional:      Appearance: Normal appearance.  HENT:     Head: Normocephalic and atraumatic.  Pulmonary:     Effort: Pulmonary effort is normal.  Skin:    General: Skin is warm and dry.  Neurological:     General: No focal deficit present.     Mental Status: She is alert.  Psychiatric:        Mood and Affect: Mood normal.        Behavior: Behavior normal.        Thought Content: Thought content normal.        Judgment: Judgment normal.      Labs and Imaging IMPRESSION: 1. Small amount of fluid or blood in the distal endometrial cavity. No focal polyp or submucosal fibroid is seen. 2. Trace anechoic pelvic cul-de-sac fluid, nonspecific, but frequently physiologic at this age. 3. Otherwise negative pelvic ultrasound. If bleeding remains unresponsive to hormonal or medical therapy, sonohysterogram should be considered for focal lesion work-up. (Ref: Radiological Reasoning: Algorithmic Workup of Abnormal Vaginal Bleeding with Endovaginal Sonography and Sonohysterography. AJR 2008; 808:D31-26)     Assessment &  Plan:   1. Encounter for preconception consultation (Primary) Noted unclear if ovulation. Initially recommend day 21 progesterone next week, but will be out of town - plan for at home OPK and if negative, will start letrozole . If positive, recommend semen analysis and possible REI referral though noted that insurance status likely limits access to advanced interventions unfortunately. Also recommend starting PNV.   - letrozole  (FEMARA ) 2.5 MG tablet; Take 1 tablet (2.5 mg total) by mouth daily. Take on days 3 to 7  following a spontaneous menses or progestin-induced bleed.  Dispense: 5 tablet; Refill: 2  2. Abnormal uterine bleeding (AUB) resolved  Routine preventative health maintenance measures emphasized.  Carter Quarry, MD [facility] for [facility], [facility]
